# Patient Record
Sex: Female | Born: 1970 | Race: White | Hispanic: No | Smoking: Never smoker
Health system: Southern US, Community
[De-identification: ages and names within clinical notes are randomized; demographics above are authoritative.]

## PROBLEM LIST (undated history)

## (undated) DIAGNOSIS — M1990 Unspecified osteoarthritis, unspecified site: Secondary | ICD-10-CM

## (undated) HISTORY — DX: Unspecified osteoarthritis, unspecified site: M19.90

---

## 2001-03-20 ENCOUNTER — Encounter: Payer: Self-pay | Admitting: Obstetrics and Gynecology

## 2001-03-20 ENCOUNTER — Ambulatory Visit (HOSPITAL_COMMUNITY): Admission: RE | Admit: 2001-03-20 | Discharge: 2001-03-20 | Payer: Self-pay | Admitting: Obstetrics and Gynecology

## 2001-05-27 ENCOUNTER — Ambulatory Visit (HOSPITAL_COMMUNITY): Admission: AD | Admit: 2001-05-27 | Discharge: 2001-05-27 | Payer: Self-pay | Admitting: Obstetrics and Gynecology

## 2001-06-02 ENCOUNTER — Ambulatory Visit (HOSPITAL_COMMUNITY): Admission: AD | Admit: 2001-06-02 | Discharge: 2001-06-02 | Payer: Self-pay | Admitting: Obstetrics and Gynecology

## 2001-06-09 ENCOUNTER — Inpatient Hospital Stay (HOSPITAL_COMMUNITY): Admission: AD | Admit: 2001-06-09 | Discharge: 2001-06-11 | Payer: Self-pay | Admitting: *Deleted

## 2004-12-10 ENCOUNTER — Ambulatory Visit: Payer: Self-pay | Admitting: Family Medicine

## 2004-12-13 ENCOUNTER — Ambulatory Visit (HOSPITAL_COMMUNITY): Admission: RE | Admit: 2004-12-13 | Discharge: 2004-12-13 | Payer: Self-pay | Admitting: Family Medicine

## 2005-01-16 ENCOUNTER — Emergency Department (HOSPITAL_COMMUNITY): Admission: EM | Admit: 2005-01-16 | Discharge: 2005-01-16 | Payer: Self-pay | Admitting: Emergency Medicine

## 2005-01-21 ENCOUNTER — Encounter (HOSPITAL_COMMUNITY): Admission: RE | Admit: 2005-01-21 | Discharge: 2005-02-20 | Payer: Self-pay | Admitting: Orthopaedic Surgery

## 2005-04-06 ENCOUNTER — Ambulatory Visit: Payer: Self-pay | Admitting: Family Medicine

## 2005-04-26 ENCOUNTER — Emergency Department (HOSPITAL_COMMUNITY): Admission: EM | Admit: 2005-04-26 | Discharge: 2005-04-26 | Payer: Self-pay | Admitting: Emergency Medicine

## 2006-01-15 ENCOUNTER — Emergency Department (HOSPITAL_COMMUNITY): Admission: EM | Admit: 2006-01-15 | Discharge: 2006-01-15 | Payer: Self-pay | Admitting: Emergency Medicine

## 2007-10-04 ENCOUNTER — Encounter: Payer: Self-pay | Admitting: Family Medicine

## 2010-11-02 NOTE — Letter (Signed)
Summary: rpc chart  rpc chart   Imported By: Curtis Sites 07/21/2010 11:31:45  _____________________________________________________________________  External Attachment:    Type:   Image     Comment:   External Document

## 2013-01-31 ENCOUNTER — Other Ambulatory Visit (HOSPITAL_COMMUNITY): Payer: Self-pay | Admitting: *Deleted

## 2013-01-31 ENCOUNTER — Ambulatory Visit (HOSPITAL_COMMUNITY)
Admission: RE | Admit: 2013-01-31 | Discharge: 2013-01-31 | Disposition: A | Discharge status: Home / Self Care | Payer: Medicaid Other | Source: Ambulatory Visit | Attending: Family Medicine | Admitting: Family Medicine

## 2013-01-31 DIAGNOSIS — M545 Low back pain, unspecified: Secondary | ICD-10-CM | POA: Insufficient documentation

## 2013-01-31 DIAGNOSIS — Q762 Congenital spondylolisthesis: Secondary | ICD-10-CM | POA: Insufficient documentation

## 2019-10-03 DIAGNOSIS — M25571 Pain in right ankle and joints of right foot: Secondary | ICD-10-CM | POA: Diagnosis not present

## 2019-10-03 DIAGNOSIS — M7989 Other specified soft tissue disorders: Secondary | ICD-10-CM | POA: Diagnosis not present

## 2019-10-19 ENCOUNTER — Encounter: Payer: Self-pay | Admitting: Family Medicine

## 2019-10-24 ENCOUNTER — Other Ambulatory Visit: Payer: Self-pay

## 2019-10-24 ENCOUNTER — Encounter: Payer: Self-pay | Admitting: Family Medicine

## 2019-10-24 ENCOUNTER — Ambulatory Visit (INDEPENDENT_AMBULATORY_CARE_PROVIDER_SITE_OTHER): Payer: Medicaid Other | Admitting: Family Medicine

## 2019-10-24 VITALS — BP 134/84 | HR 91 | Temp 98.9°F | Ht 65.0 in | Wt 265.0 lb

## 2019-10-24 DIAGNOSIS — Z23 Encounter for immunization: Secondary | ICD-10-CM

## 2019-10-24 DIAGNOSIS — Z0000 Encounter for general adult medical examination without abnormal findings: Secondary | ICD-10-CM

## 2019-10-24 DIAGNOSIS — M1990 Unspecified osteoarthritis, unspecified site: Secondary | ICD-10-CM | POA: Diagnosis not present

## 2019-10-24 MED ORDER — MELOXICAM 15 MG PO TABS: 15.0000 mg | ORAL_TABLET | Freq: Every day | ORAL | 2 refills | Status: DC

## 2019-10-24 MED ORDER — SHINGRIX 50 MCG/0.5ML IM SUSR: 0.5000 mL | Freq: Once | INTRAMUSCULAR | 0 refills | Status: AC

## 2019-10-24 NOTE — Patient Instructions (Addendum)
Call office back to schedule your mammogram on the bus.  Ibuprofen 400-600 (2-3 tablets) WITH Tylenol 500 mg every 6-8 hours as needed for pain.    Stool DNA Testing for Colon Cancer Why am I having this test? Colon cancer is the second-leading cause of cancer deaths in men and women. Testing for colon cancer before symptoms develop (screening) reduces your risk for this cancer. Stool DNA testing, also called the FIT-DNA test, is one method of screening for colon cancer. Colon cancer grows slowly, so finding the cancer early means a better chance for effective treatment. All adults should have colon cancer screening starting at age 56 and continuing until age 83. Your health care provider may recommend screening at age 76. Screening may include having stool DNA testing every 3 years if:  You have no symptoms of colon cancer. Symptoms include rectal bleeding, weight loss, and changes in bowel habits.  You have an average risk of colon cancer. Average risk means: ? You do not have precancerous polyps. ? You do not have family or personal history of either colon cancer or a colon disease that increases your risk for colon cancer. What is being tested? For the test, a sample of your stool (feces) is checked for blood and changes in DNA that could lead to cancer. Growths in your colon that are cancerous (malignant) or may become cancerous (precancerous polyps) bleed and shed cells. Blood and cells can be picked up by stool as it passes through your colon. This test checks for blood cells as well as nine types of DNA (biomarkers) in three genes that have been linked to colon cancer and precancerous polyps. What kind of sample is taken?  This test uses a stool sample that is collected when you have a bowel movement. How do I collect samples at home? You will be sent a stool sample collection kit in the mail. It will include instructions and everything you need to get the sample. Instructions may  include these steps:  Store the kit in a dry place at room temperature until you are ready to collect the sample. Keep the kit away from heat and sunlight.  To collect the sample, place the collection device over the toilet.  Collect the sample according to the instructions that came with your kit.  After collecting a stool sample, follow instructions carefully regarding mixing the stool with a preservative and sealing the sample.  Send the sample to the lab in the postage-paid box that was included in the kit. Your stool sample will be checked within 3 days. The results will be sent to your health care provider. How do I prepare for this test? There is no preparation required for this test. However, do not collect a stool sample if:  You have bleeding hemorrhoids.  You have any rectal bleeding.  You have a cut on your hand or finger.  You have your menstrual period.  You have diarrhea. How are the results reported? Your test results will be reported as either positive or negative. It is up to you to get your test results. Ask your health care provider, or the department that is doing the test, when your results will be ready. What do the results mean?  A positive result means that the test found abnormal DNA, blood cells, or both. If you have a positive result, you will need to have a follow-up exam of your colon done with a scope (colonoscopy).  A negative result means that no  blood or changes in DNA were found. This does not guarantee that you do not have colon cancer. Your health care provider may recommend that you have other screening tests. Talk with your health care provider to discuss your results, treatment options, and if necessary, the need for more tests. Talk with your health care provider if you have any questions about your results. Questions to ask [your / your child's] health care provider Ask [your / your child's] health care provider, or the department that is  doing the test: This information is not intended to replace advice given to you by your health care provider. Make sure you discuss any questions you have with your health care provider. Document Revised: 01/10/2019 Document Reviewed: 06/09/2016 Elsevier Patient Education  [place].   Arthritis Arthritis means joint pain. It can also mean joint disease. A joint is a place where bones come together. There are more than 100 types of arthritis. What are the causes? This condition may be caused by:  Wear and tear of a joint. This is the most common cause.  A lot of acid in the blood, which leads to pain in the joint (gout).  Pain and swelling (inflammation) in a joint.  Infection of a joint.  Injuries in the joint.  A reaction to medicines (allergy). In some cases, the cause may not be known. What are the signs or symptoms? Symptoms of this condition include:  Redness at a joint.  Swelling at a joint.  Stiffness at a joint.  Warmth coming from the joint.  A fever.  A feeling of being sick. How is this treated? This condition may be treated with:  Treating the cause, if it is known.  Rest.  Raising (elevating) the joint.  Putting cold or hot packs on the joint.  Medicines to treat symptoms and reduce pain and swelling.  Shots of medicines (cortisone) into the joint. You may also be told to make changes in your life, such as doing exercises and losing weight. Follow these instructions at home: Medicines  Take over-the-counter and prescription medicines only as told by your doctor.  Do not take aspirin for pain if your doctor says that you may have gout. Activity  Rest your joint if your doctor tells you to.  Avoid activities that make the pain worse.  Exercise your joint regularly as told by your doctor. Try doing exercises like: ? Swimming. ? Water aerobics. ? Biking. ? Walking. Managing pain, stiffness, and swelling      If told, put ice  on the affected area. ? Put ice in a plastic bag. ? Place a towel between your skin and the bag. ? Leave the ice on for 20 minutes, 2-3 times per day.  If your joint is swollen, raise (elevate) it above the level of your heart if told by your doctor.  If your joint feels stiff in the morning, try taking a warm shower.  If told, put heat on the affected area. Do this as often as told by your doctor. Use the heat source that your doctor recommends, such as a moist heat pack or a heating pad. If you have diabetes, do not apply heat without asking your doctor. To apply heat: ? Place a towel between your skin and the heat source. ? Leave the heat on for 20-30 minutes. ? Remove the heat if your skin turns bright red. This is very important if you are unable to feel pain, heat, or cold. You may  have a greater risk of getting burned. General instructions  Do not use any products that contain nicotine or tobacco, such as cigarettes, e-cigarettes, and chewing tobacco. If you need help quitting, ask your doctor.  Keep all follow-up visits as told by your doctor. This is important. Contact a doctor if:  The pain gets worse.  You have a fever. Get help right away if:  You have very bad pain in your joint.  You have swelling in your joint.  Your joint is red.  Many joints become painful and swollen.  You have very bad back pain.  Your leg is very weak.  You cannot control your pee (urine) or poop (stool). Summary  Arthritis means joint pain. It can also mean joint disease. A joint is a place where bones come together.  The most common cause of this condition is wear and tear of a joint.  Symptoms of this condition include redness, swelling, or stiffness of the joint.  This condition is treated with rest, raising the joint, medicines, and putting cold or hot packs on the joint.  Follow your doctor's instructions about medicines, activity, exercises, and other home care  treatments. This information is not intended to replace advice given to you by your health care provider. Make sure you discuss any questions you have with your health care provider. Document Revised: 08/27/2018 Document Reviewed: 08/27/2018 Elsevier Patient Education  2020 Elsevier Inc.   Preventive Care 41-65 Years Old, Female Preventive care refers to visits with your health care provider and lifestyle choices that can promote health and wellness. This includes:  A yearly physical exam. This may also be called an annual well check.  Regular dental visits and eye exams.  Immunizations.  Screening for certain conditions.  Healthy lifestyle choices, such as eating a healthy diet, getting regular exercise, not using drugs or products that contain nicotine and tobacco, and limiting alcohol use. What can I expect for my preventive care visit? Physical exam Your health care provider will check your:  Height and weight. This may be used to calculate body mass index (BMI), which tells if you are at a healthy weight.  Heart rate and blood pressure.  Skin for abnormal spots. Counseling Your health care provider may ask you questions about your:  Alcohol, tobacco, and drug use.  Emotional well-being.  Home and relationship well-being.  Sexual activity.  Eating habits.  Work and work Statistician.  Method of birth control.  Menstrual cycle.  Pregnancy history. What immunizations do I need?  Influenza (flu) vaccine  This is recommended every year. Tetanus, diphtheria, and pertussis (Tdap) vaccine  You may need a Td booster every 10 years. Varicella (chickenpox) vaccine  You may need this if you have not been vaccinated. Zoster (shingles) vaccine  You may need this after age 67. Measles, mumps, and rubella (MMR) vaccine  You may need at least one dose of MMR if you were born in 1957 or later. You may also need a second dose. Pneumococcal conjugate (PCV13)  vaccine  You may need this if you have certain conditions and were not previously vaccinated. Pneumococcal polysaccharide (PPSV23) vaccine  You may need one or two doses if you smoke cigarettes or if you have certain conditions. Meningococcal conjugate (MenACWY) vaccine  You may need this if you have certain conditions. Hepatitis A vaccine  You may need this if you have certain conditions or if you travel or work in places where you may be exposed to hepatitis A. Hepatitis  B vaccine  You may need this if you have certain conditions or if you travel or work in places where you may be exposed to hepatitis B. Haemophilus influenzae type b (Hib) vaccine  You may need this if you have certain conditions. Human papillomavirus (HPV) vaccine  If recommended by your health care provider, you may need three doses over 6 months. You may receive vaccines as individual doses or as more than one vaccine together in one shot (combination vaccines). Talk with your health care provider about the risks and benefits of combination vaccines. What tests do I need? Blood tests  Lipid and cholesterol levels. These may be checked every 5 years, or more frequently if you are over 56 years old.  Hepatitis C test.  Hepatitis B test. Screening  Lung cancer screening. You may have this screening every year starting at age 63 if you have a 30-pack-year history of smoking and currently smoke or have quit within the past 15 years.  Colorectal cancer screening. All adults should have this screening starting at age 33 and continuing until age 43. Your health care provider may recommend screening at age 25 if you are at increased risk. You will have tests every 1-10 years, depending on your results and the type of screening test.  Diabetes screening. This is done by checking your blood sugar (glucose) after you have not eaten for a while (fasting). You may have this done every 1-3 years.  Mammogram. This may be  done every 1-2 years. Talk with your health care provider about when you should start having regular mammograms. This may depend on whether you have a family history of breast cancer.  BRCA-related cancer screening. This may be done if you have a family history of breast, ovarian, tubal, or peritoneal cancers.  Pelvic exam and Pap test. This may be done every 3 years starting at age 47. Starting at age 38, this may be done every 5 years if you have a Pap test in combination with an HPV test. Other tests  Sexually transmitted disease (STD) testing.  Bone density scan. This is done to screen for osteoporosis. You may have this scan if you are at high risk for osteoporosis. Follow these instructions at home: Eating and drinking  Eat a diet that includes fresh fruits and vegetables, whole grains, lean protein, and low-fat dairy.  Take vitamin and mineral supplements as recommended by your health care provider.  Do not drink alcohol if: ? Your health care provider tells you not to drink. ? You are pregnant, may be pregnant, or are planning to become pregnant.  If you drink alcohol: ? Limit how much you have to 0-1 drink a day. ? Be aware of how much alcohol is in your drink. In the [place] one drink equals one 12 oz bottle of beer (355 mL), one 5 oz glass of wine (148 mL), or one 1 oz glass of hard liquor (44 mL). Lifestyle  Take daily care of your teeth and gums.  Stay active. Exercise for at least 30 minutes on 5 or more days each week.  Do not use any products that contain nicotine or tobacco, such as cigarettes, e-cigarettes, and chewing tobacco. If you need help quitting, ask your health care provider.  If you are sexually active, practice safe sex. Use a condom or other form of birth control (contraception) in order to prevent pregnancy and STIs (sexually transmitted infections).  If told by your health care provider, take low-dose aspirin  daily starting at age 21. What's  next?  Visit your health care provider once a year for a well check visit.  Ask your health care provider how often you should have your eyes and teeth checked.  Stay up to date on all vaccines. This information is not intended to replace advice given to you by your health care provider. Make sure you discuss any questions you have with your health care provider. Document Revised: 05/31/2018 Document Reviewed: 05/31/2018 Elsevier Patient Education  2020 [employer].

## 2019-10-24 NOTE — Progress Notes (Signed)
New Patient Office Visit  Assessment & Plan:  1. Well adult exam - Preventive care education provided. Education provided on [employer]. Patient is thinking about Cologuard and a Pap smear. Her last Pap smear was when she had her last child 17 years ago. She declined any lab work today. Patient will call office back to schedule her mammogram as she does not have her schedule with her today. Declined influenza and tetanus.  2. Arthritis - Ibuprofen 400-600 (2-3 tablets) WITH Tylenol 500 mg every 6-8 hours as needed for pain OR meloxicam (not to take both meloxicam and Ibuprofen). Education provided on arthritis. - meloxicam (MOBIC) 15 MG tablet; Take 1 tablet (15 mg total) by mouth daily.  Dispense: 30 tablet; Refill: 2  3. Morbid obesity ([place]) - Weight loss encouraged.   4. Immunization due - SHINGRIX injection; Inject 0.5 mLs into the muscle once for 1 dose.  Dispense: 0.5 mL; Refill: 0   Follow-up: Return in about 1 year (around 10/23/2020) for annual physical.   Hendricks Limes, MSN, APRN, FNP-C [facility]  Subjective:  Patient ID: Wanda Murphy, female    DOB: Sep 28, 1971  Age: 49 y.o. MRN: [id number]  Patient Care Team: Loman Brooklyn, FNP as PCP - General (Family Medicine)  CC:  Chief Complaint  Patient presents with  . New Patient (Initial Visit)    HPI Wanda Murphy presents to establish care. Patient did not have previous PCP.    Patient is also following up from an ER visit on 10/03/19. She presented to [facility] due to right ankle pain and popping.  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 She was given meloxicam 7.5 mg which she reports is somewhat effective but not enough. She was advised to see a podiatrist but has not done so as she reports local physicians do not accept Medicaid. Patient works as a Scientist, water quality.    Review of Systems    Constitutional: Negative for chills, fever, malaise/fatigue and weight loss.  HENT: Negative for congestion, ear discharge, ear pain, nosebleeds, sinus pain, sore throat and tinnitus.   Eyes: Negative for blurred vision, double vision, pain, discharge and redness.  Respiratory: Negative for cough, shortness of breath and wheezing.   Cardiovascular: Negative for chest pain, palpitations and leg swelling.  Gastrointestinal: Negative for abdominal pain, constipation, diarrhea, heartburn, nausea and vomiting.  Genitourinary: Negative for dysuria, frequency and urgency.  Musculoskeletal: Positive for joint pain. Negative for myalgias.  Skin: Negative for rash.  Neurological: Negative for dizziness, seizures, weakness and headaches.  Psychiatric/Behavioral: Negative for depression, substance abuse and suicidal ideas. The patient is not nervous/anxious.     Current Outpatient Medications:  .  meloxicam (MOBIC) 15 MG tablet, Take 1 tablet (15 mg total) by mouth daily., Disp: 30 tablet, Rfl: 2  No Known Allergies  Past Medical History:  Diagnosis Date  . Arthritis     Past Surgical History:  Procedure Laterality Date  . TUBAL LIGATION  2004    Family History  Problem Relation Age of Onset  . Breast cancer Mother 67  . Early death Father        fall with head injury  . Diabetes Maternal Grandmother   . Stroke Maternal Grandmother   . Early death Maternal Grandmother        33s  . Heart attack Maternal Grandfather   . Early death Paternal Grandfather  farming accident  . ADD / ADHD Son   . Other Son        joint problems  . Mental retardation Daughter     Social History   Socioeconomic History  . Marital status: Single    Spouse name: Not on file  . Number of children: Not on file  . Years of education: Not on file  . Highest education level: Not on file  Occupational History  . Not on file  Tobacco Use  . Smoking status: Never Smoker  . Smokeless tobacco: Never  Used  Substance and Sexual Activity  . Alcohol use: Never  . Drug use: Never  . Sexual activity: Not Currently  Other Topics Concern  . Not on file  Social History Narrative  . Not on file   Social Determinants of Health   Financial Resource Strain:   . Difficulty of Paying Living Expenses: Not on file  Food Insecurity:   . Worried About Programme researcher, broadcasting/film/video in the Last Year: Not on file  . Ran Out of Food in the Last Year: Not on file  Transportation Needs:   . Lack of Transportation (Medical): Not on file  . Lack of Transportation (Non-Medical): Not on file  Physical Activity:   . Days of Exercise per Week: Not on file  . Minutes of Exercise per Session: Not on file  Stress:   . Feeling of Stress : Not on file  Social Connections:   . Frequency of Communication with Friends and Family: Not on file  . Frequency of Social Gatherings with Friends and Family: Not on file  . Attends Religious Services: Not on file  . Active Member of Clubs or Organizations: Not on file  . Attends Banker Meetings: Not on file  . Marital Status: Not on file  Intimate Partner Violence:   . Fear of Current or Ex-Partner: Not on file  . Emotionally Abused: Not on file  . Physically Abused: Not on file  . Sexually Abused: Not on file    Objective:   Today's Vitals: BP 134/84 Comment: manual  Pulse 91   Temp 98.9 F (37.2 C) (Temporal)   Ht 5\' 5"  (1.651 m)   Wt 265 lb (120.2 kg)   SpO2 98%   BMI 44.10 kg/m   Physical Exam Vitals reviewed.  Constitutional:      General: She is not in acute distress.    Appearance: Normal appearance. She is morbidly obese. She is not ill-appearing, toxic-appearing or diaphoretic.  HENT:     Head: Normocephalic and atraumatic.     Right Ear: Tympanic membrane, ear canal and external ear normal. There is no impacted cerumen.     Left Ear: Tympanic membrane, ear canal and external ear normal. There is no impacted cerumen.     Nose: Nose  normal. No congestion or rhinorrhea.     Mouth/Throat:     Mouth: Mucous membranes are moist.     Pharynx: Oropharynx is clear. No oropharyngeal exudate or posterior oropharyngeal erythema.  Eyes:     General: No scleral icterus.       Right eye: No discharge.        Left eye: No discharge.     Conjunctiva/sclera: Conjunctivae normal.     Pupils: Pupils are equal, round, and reactive to light.  Cardiovascular:     Rate and Rhythm: Normal rate and regular rhythm.     Heart sounds: Normal heart sounds. No murmur. No friction  rub. No gallop.   Pulmonary:     Effort: Pulmonary effort is normal. No respiratory distress.     Breath sounds: Normal breath sounds. No stridor. No wheezing, rhonchi or rales.  Abdominal:     General: Abdomen is flat. Bowel sounds are normal. There is no distension.     Palpations: Abdomen is soft. There is no mass.     Tenderness: There is no abdominal tenderness. There is no guarding or rebound.     Hernia: No hernia is present.  Musculoskeletal:        General: Normal range of motion.     Cervical back: Normal range of motion and neck supple. No rigidity. No muscular tenderness.  Lymphadenopathy:     Cervical: No cervical adenopathy.  Skin:    General: Skin is warm and dry.     Capillary Refill: Capillary refill takes less than 2 seconds.  Neurological:     General: No focal deficit present.     Mental Status: She is alert and oriented to person, place, and time. Mental status is at baseline.  Psychiatric:        Mood and Affect: Mood normal.        Behavior: Behavior normal.        Thought Content: Thought content normal.        Judgment: Judgment normal.

## 2019-11-10 DIAGNOSIS — R05 Cough: Secondary | ICD-10-CM | POA: Diagnosis not present

## 2019-11-10 DIAGNOSIS — H6503 Acute serous otitis media, bilateral: Secondary | ICD-10-CM | POA: Diagnosis not present

## 2019-12-19 ENCOUNTER — Encounter: Payer: Self-pay | Admitting: Family Medicine

## 2019-12-19 ENCOUNTER — Other Ambulatory Visit: Payer: Self-pay

## 2019-12-19 ENCOUNTER — Encounter: Payer: Medicaid Other | Admitting: Family Medicine

## 2019-12-19 DIAGNOSIS — R197 Diarrhea, unspecified: Secondary | ICD-10-CM | POA: Diagnosis not present

## 2019-12-19 DIAGNOSIS — R112 Nausea with vomiting, unspecified: Secondary | ICD-10-CM | POA: Diagnosis not present

## 2019-12-19 DIAGNOSIS — R5383 Other fatigue: Secondary | ICD-10-CM | POA: Diagnosis not present

## 2019-12-19 NOTE — Progress Notes (Signed)
Patient went to urgent care

## 2020-02-02 DIAGNOSIS — H5213 Myopia, bilateral: Secondary | ICD-10-CM | POA: Diagnosis not present

## 2021-01-16 DIAGNOSIS — S93402A Sprain of unspecified ligament of left ankle, initial encounter: Secondary | ICD-10-CM | POA: Diagnosis not present

## 2021-01-16 DIAGNOSIS — X500XXA Overexertion from strenuous movement or load, initial encounter: Secondary | ICD-10-CM | POA: Diagnosis not present

## 2021-01-16 DIAGNOSIS — S8392XA Sprain of unspecified site of left knee, initial encounter: Secondary | ICD-10-CM | POA: Diagnosis not present

## 2021-01-16 DIAGNOSIS — M25562 Pain in left knee: Secondary | ICD-10-CM | POA: Diagnosis not present

## 2021-01-16 DIAGNOSIS — M7989 Other specified soft tissue disorders: Secondary | ICD-10-CM | POA: Diagnosis not present

## 2021-01-18 ENCOUNTER — Telehealth: Payer: Self-pay | Admitting: *Deleted

## 2021-01-18 NOTE — Telephone Encounter (Signed)
Transition Care Management Follow-up Telephone Call  Date of discharge and from where: 01/16/2021 [facility] ED  How have you been since you were released from the hospital? "Still in a bit of pain but it's a little better"  Any questions or concerns? No  Items Reviewed:  Did the pt receive and understand the discharge instructions provided? Yes   Medications obtained and verified? Yes   Other? No   Any new allergies since your discharge? No   Dietary orders reviewed? No  Do you have support at home? Yes    Functional Questionnaire: (I = Independent and D = Dependent) ADLs: I  Bathing/Dressing- I  Meal Prep- I  Eating- I  Maintaining continence- I  Transferring/Ambulation- I  Managing Meds- I  Follow up appointments reviewed:   PCP Hospital f/u appt confirmed? No    Specialist Hospital f/u appt confirmed? No    Are transportation arrangements needed? N/A  If their condition worsens, is the pt aware to call PCP or go to the Emergency Dept.? Yes  Was the patient provided with contact information for the PCP's office or ED? Yes  Was to pt encouraged to call back with questions or concerns? Yes

## 2021-01-28 ENCOUNTER — Other Ambulatory Visit: Payer: Self-pay | Admitting: Family Medicine

## 2021-01-28 DIAGNOSIS — Z1231 Encounter for screening mammogram for malignant neoplasm of breast: Secondary | ICD-10-CM

## 2021-03-18 DIAGNOSIS — S29012A Strain of muscle and tendon of back wall of thorax, initial encounter: Secondary | ICD-10-CM | POA: Diagnosis not present

## 2021-04-16 ENCOUNTER — Ambulatory Visit (INDEPENDENT_AMBULATORY_CARE_PROVIDER_SITE_OTHER): Payer: Medicaid Other | Admitting: Family Medicine

## 2021-04-16 ENCOUNTER — Encounter: Payer: Self-pay | Admitting: Family Medicine

## 2021-04-16 ENCOUNTER — Other Ambulatory Visit: Payer: Self-pay

## 2021-04-16 VITALS — BP 124/84 | HR 89 | Temp 97.9°F | Ht 65.0 in | Wt 267.4 lb

## 2021-04-16 DIAGNOSIS — M722 Plantar fascial fibromatosis: Secondary | ICD-10-CM | POA: Diagnosis not present

## 2021-04-16 DIAGNOSIS — Z0000 Encounter for general adult medical examination without abnormal findings: Secondary | ICD-10-CM | POA: Diagnosis not present

## 2021-04-16 DIAGNOSIS — M7989 Other specified soft tissue disorders: Secondary | ICD-10-CM | POA: Diagnosis not present

## 2021-04-16 DIAGNOSIS — M7918 Myalgia, other site: Secondary | ICD-10-CM

## 2021-04-16 DIAGNOSIS — Z23 Encounter for immunization: Secondary | ICD-10-CM

## 2021-04-16 DIAGNOSIS — Z0001 Encounter for general adult medical examination with abnormal findings: Secondary | ICD-10-CM

## 2021-04-16 LAB — BAYER DCA HB A1C WAIVED: HB A1C (BAYER DCA - WAIVED): 5.6 % (ref <7.0)

## 2021-04-16 NOTE — Patient Instructions (Addendum)
Biofreeze for the bottom of your foot when it feels tight.  Start wearing compression hose for swelling.   Preventive Care 52-51 Years Old, Female Preventive care refers to lifestyle choices and visits with your health care provider that can promote health and wellness. This includes: A yearly physical exam. This is also called an annual wellness visit. Regular dental and eye exams. Immunizations. Screening for certain conditions. Healthy lifestyle choices, such as: Eating a healthy diet. Getting regular exercise. Not using drugs or products that contain nicotine and tobacco. Limiting alcohol use. What can I expect for my preventive care visit? Physical exam Your health care provider will check your: Height and weight. These may be used to calculate your BMI (body mass index). BMI is a measurement that tells if you are at a healthy weight. Heart rate and blood pressure. Body temperature. Skin for abnormal spots. Counseling Your health care provider may ask you questions about your: Past medical problems. Family's medical history. Alcohol, tobacco, and drug use. Emotional well-being. Home life and relationship well-being. Sexual activity. Diet, exercise, and sleep habits. Work and work Statistician. Access to firearms. Method of birth control. Menstrual cycle. Pregnancy history. What immunizations do I need?  Vaccines are usually given at various ages, according to a schedule. Your health care provider will recommend vaccines for you based on your age, medicalhistory, and lifestyle or other factors, such as travel or where you work. What tests do I need? Blood tests Lipid and cholesterol levels. These may be checked every 5 years, or more often if you are over 35 years old. Hepatitis C test. Hepatitis B test. Screening Lung cancer screening. You may have this screening every year starting at age 82 if you have a 30-pack-year history of smoking and currently smoke or have  quit within the past 15 years. Colorectal cancer screening. All adults should have this screening starting at age 51 and continuing until age 51. Your health care provider may recommend screening at age 30 if you are at increased risk. You will have tests every 1-10 years, depending on your results and the type of screening test. Diabetes screening. This is done by checking your blood sugar (glucose) after you have not eaten for a while (fasting). You may have this done every 1-3 years. Mammogram. This may be done every 1-2 years. Talk with your health care provider about when you should start having regular mammograms. This may depend on whether you have a family history of breast cancer. BRCA-related cancer screening. This may be done if you have a family history of breast, ovarian, tubal, or peritoneal cancers. Pelvic exam and Pap test. This may be done every 3 years starting at age 51. Starting at age 51, this may be done every 5 years if you have a Pap test in combination with an HPV test. Other tests STD (sexually transmitted disease) testing, if you are at risk. Bone density scan. This is done to screen for osteoporosis. You may have this scan if you are at high risk for osteoporosis. Talk with your health care provider about your test results, treatment options,and if necessary, the need for more tests. Follow these instructions at home: Eating and drinking  Eat a diet that includes fresh fruits and vegetables, whole grains, lean protein, and low-fat dairy products. Take vitamin and mineral supplements as recommended by your health care provider. Do not drink alcohol if: Your health care provider tells you not to drink. You are pregnant, may be pregnant, or are  planning to become pregnant. If you drink alcohol: Limit how much you have to 0-1 drink a day. Be aware of how much alcohol is in your drink. In the [place] one drink equals one 12 oz bottle of beer (355 mL), one 5 oz glass  of wine (148 mL), or one 1 oz glass of hard liquor (44 mL).  Lifestyle Take daily care of your teeth and gums. Brush your teeth every morning and night with fluoride toothpaste. Floss one time each day. Stay active. Exercise for at least 30 minutes 5 or more days each week. Do not use any products that contain nicotine or tobacco, such as cigarettes, e-cigarettes, and chewing tobacco. If you need help quitting, ask your health care provider. Do not use drugs. If you are sexually active, practice safe sex. Use a condom or other form of protection to prevent STIs (sexually transmitted infections). If you do not wish to become pregnant, use a form of birth control. If you plan to become pregnant, see your health care provider for a prepregnancy visit. If told by your health care provider, take low-dose aspirin daily starting at age 8. Find healthy ways to cope with stress, such as: Meditation, yoga, or listening to music. Journaling. Talking to a trusted person. Spending time with friends and family. Safety Always wear your seat belt while driving or riding in a vehicle. Do not drive: If you have been drinking alcohol. Do not ride with someone who has been drinking. When you are tired or distracted. While texting. Wear a helmet and other protective equipment during sports activities. If you have firearms in your house, make sure you follow all gun safety procedures. What's next? Visit your health care provider once a year for an annual wellness visit. Ask your health care provider how often you should have your eyes and teeth checked. Stay up to date on all vaccines. This information is not intended to replace advice given to you by your health care provider. Make sure you discuss any questions you have with your healthcare provider. Document Revised: 06/23/2020 Document Reviewed: 05/31/2018 Elsevier Patient Education  2022 [employer].

## 2021-04-16 NOTE — Progress Notes (Signed)
Assessment & Plan:  1. Well adult exam Preventive health education provided. Patient declined COVID, hepatitis C and HIV screening. She is thinking about Cologuard vs a colonoscopy. She will get Shingrix at the pharmacy and complete her mammogram on the bus that comes to our office.  - Anemia Profile B - CMP14+EGFR - Lipid panel - TSH  2. Morbid obesity ([place]) Encouraged to work on healthy eating and exercise prior to starting a medication to help with weight loss. She will see our clinical pharmacist regarding medication. Education provided on obesity. - Anemia Profile B - CMP14+EGFR - Lipid panel - TSH - Bayer DCA Hb A1c Waived  3. Musculoskeletal pain Encouraged to take something for the pain as needed.  4. Swelling of both lower extremities Encouraged to wear compression hose. - Anemia Profile B - CMP14+EGFR - Lipid panel - TSH  5. Immunization due - Varicella-zoster vaccine IM (Shingrix) - patient to get at the pharmacy.   6. Plantar fasciitis of right foot Encouraged to purchase good support shoes, wear compression hose, and apply Biofreeze to the bottom of her foot as needed. I did not prescribe steroids since she is not having the pain daily and it is not present currently.    Follow-up: Return in about 6 weeks (around 05/28/2021) for weight with Almyra Free.   Hendricks Limes, MSN, APRN, FNP-C [facility]  Subjective:  Patient ID: Wanda Murphy, female    DOB: 27-Oct-1970  Age: 50 y.o. MRN: [id number]  Patient Care Team: Loman Brooklyn, FNP as PCP - General (Family Medicine)   CC:  Chief Complaint  Patient presents with   Annual Exam    HPI Wanda Murphy presents for her annual physical.  Occupation: Scientist, water quality at [employer], Marital status: Widowed, Substance use: None Diet: Regular, Exercise: None Last eye exam: 2 months ago Last dental exam: Does not go Last colonoscopy: Never Last mammogram: Never Last pap smear: 17+ years  ago Hepatitis C Screening: Declined Immunizations: Flu Vaccine:  not flu season Tdap Vaccine: up to date  Shingrix Vaccine: at the pharmacy for administration, so it can be run through insurance first   COVID-19 Vaccine: declined  DEPRESSION SCREENING PHQ 2/9 Scores 04/16/2021 10/24/2019  PHQ - 2 Score 1 0  PHQ- 9 Score 8 0     Patient reports a muscular pain across the top of her chest just under her left collarbone and in her left shoulder that occurs first thing in the morning. She reports she sleeps awkwardly on her left shoulder all night. She does not take anything for the pain.  Also reports a pain/tight feeling in the bottom of her right foot after being on her feet at work all day. Does not occur when she is not at work. This has been going on for several weeks.   She also reports swelling of both feet that is worse after standing all day at work.   She is concerned about her weight, although she is not currently doing any exercise or following a healthy diet.    Review of Systems  Constitutional:  Negative for chills, fever, malaise/fatigue and weight loss.  HENT:  Negative for congestion, ear discharge, ear pain, nosebleeds, sinus pain, sore throat and tinnitus.   Eyes:  Negative for blurred vision, double vision, pain, discharge and redness.  Respiratory:  Negative for cough, shortness of breath and wheezing.   Cardiovascular:  Positive for leg swelling. Negative for chest pain and palpitations.  Gastrointestinal:  Negative for abdominal pain, constipation, diarrhea, heartburn, nausea and vomiting.  Genitourinary:  Negative for dysuria, frequency and urgency.  Musculoskeletal:  Positive for joint pain. Negative for myalgias.  Skin:  Negative for rash.  Neurological:  Negative for dizziness, seizures, weakness and headaches.  Psychiatric/Behavioral:  Negative for depression, substance abuse and suicidal ideas. The patient is not nervous/anxious.    No current outpatient  medications on file.  No Known Allergies  Past Medical History:  Diagnosis Date   Arthritis     Past Surgical History:  Procedure Laterality Date   TUBAL LIGATION  2004    Family History  Problem Relation Age of Onset   Breast cancer Mother 63   Early death Father        fall with head injury   Diabetes Maternal Grandmother    Stroke Maternal Grandmother    Early death Maternal Grandmother        11s   Heart attack Maternal Grandfather    Early death Paternal Grandfather        farming accident   ADD / ADHD Son    Other Son        joint problems   Mental retardation Daughter     Social History   Socioeconomic History   Marital status: Single    Spouse name: Not on file   Number of children: Not on file   Years of education: Not on file   Highest education level: Not on file  Occupational History   Not on file  Tobacco Use   Smoking status: Never   Smokeless tobacco: Never  Vaping Use   Vaping Use: Never used  Substance and Sexual Activity   Alcohol use: Never   Drug use: Never   Sexual activity: Not Currently  Other Topics Concern   Not on file  Social History Narrative   Not on file   Social Determinants of Health   Financial Resource Strain: Not on file  Food Insecurity: Not on file  Transportation Needs: Not on file  Physical Activity: Not on file  Stress: Not on file  Social Connections: Not on file  Intimate Partner Violence: Not on file      Objective:    BP 124/84   Pulse 89   Temp 97.9 F (36.6 C) (Temporal)   Ht '5\' 5"'  (1.651 m)   Wt 267 lb 6.4 oz (121.3 kg)   SpO2 97%   BMI 44.50 kg/m   Wt Readings from Last 3 Encounters:  04/16/21 267 lb 6.4 oz (121.3 kg)  10/24/19 265 lb (120.2 kg)    Physical Exam Vitals reviewed.  Constitutional:      General: She is not in acute distress.    Appearance: Normal appearance. She is morbidly obese. She is not ill-appearing, toxic-appearing or diaphoretic.  HENT:     Head:  Normocephalic and atraumatic.     Right Ear: Tympanic membrane, ear canal and external ear normal. There is no impacted cerumen.     Left Ear: Tympanic membrane, ear canal and external ear normal. There is no impacted cerumen.     Nose: Nose normal. No congestion or rhinorrhea.     Mouth/Throat:     Mouth: Mucous membranes are moist.     Pharynx: Oropharynx is clear. No oropharyngeal exudate or posterior oropharyngeal erythema.  Eyes:     General: No scleral icterus.       Right eye: No discharge.        Left eye:  No discharge.     Conjunctiva/sclera: Conjunctivae normal.     Pupils: Pupils are equal, round, and reactive to light.  Cardiovascular:     Rate and Rhythm: Normal rate and regular rhythm.     Heart sounds: Normal heart sounds. No murmur heard.   No friction rub. No gallop.  Pulmonary:     Effort: Pulmonary effort is normal. No respiratory distress.     Breath sounds: Normal breath sounds. No stridor. No wheezing, rhonchi or rales.  Abdominal:     General: Abdomen is flat. Bowel sounds are normal. There is no distension.     Palpations: Abdomen is soft. There is no mass.     Tenderness: There is no abdominal tenderness. There is no guarding or rebound.     Hernia: No hernia is present.  Musculoskeletal:        General: Normal range of motion.     Cervical back: Normal range of motion and neck supple. No rigidity. No muscular tenderness.  Lymphadenopathy:     Cervical: No cervical adenopathy.  Skin:    General: Skin is warm and dry.     Capillary Refill: Capillary refill takes less than 2 seconds.  Neurological:     General: No focal deficit present.     Mental Status: She is alert and oriented to person, place, and time. Mental status is at baseline.  Psychiatric:        Mood and Affect: Mood normal.        Behavior: Behavior normal.        Thought Content: Thought content normal.        Judgment: Judgment normal.    No results found for: TSH No results found  for: WBC, HGB, HCT, MCV, PLT No results found for: NA, K, CHLORIDE, CO2, GLUCOSE, BUN, CREATININE, BILITOT, ALKPHOS, AST, ALT, PROT, ALBUMIN, CALCIUM, ANIONGAP, EGFR, GFR No results found for: CHOL No results found for: HDL No results found for: LDLCALC No results found for: TRIG No results found for: CHOLHDL No results found for: HGBA1C

## 2021-04-17 LAB — CMP14+EGFR
ALT: 17 IU/L (ref 0–32)
AST: 19 IU/L (ref 0–40)
Albumin/Globulin Ratio: 1.3 (ref 1.2–2.2)
Albumin: 4.3 g/dL (ref 3.8–4.9)
Alkaline Phosphatase: 112 IU/L (ref 44–121)
BUN/Creatinine Ratio: 21 (ref 9–23)
BUN: 17 mg/dL (ref 6–24)
Bilirubin Total: 0.3 mg/dL (ref 0.0–1.2)
CO2: 20 mmol/L (ref 20–29)
Calcium: 10.5 mg/dL — ABNORMAL HIGH (ref 8.7–10.2)
Chloride: 103 mmol/L (ref 96–106)
Creatinine, Ser: 0.82 mg/dL (ref 0.57–1.00)
Globulin, Total: 3.2 g/dL (ref 1.5–4.5)
Glucose: 96 mg/dL (ref 65–99)
Potassium: 4.4 mmol/L (ref 3.5–5.2)
Sodium: 139 mmol/L (ref 134–144)
Total Protein: 7.5 g/dL (ref 6.0–8.5)
eGFR: 87 mL/min/{1.73_m2} (ref >59)

## 2021-04-17 LAB — ANEMIA PROFILE B
Basophils Absolute: 0.1 10*3/uL (ref 0.0–0.2)
Basos: 1 %
EOS (ABSOLUTE): 0.1 10*3/uL (ref 0.0–0.4)
Eos: 2 %
Ferritin: 78 ng/mL (ref 15–150)
Folate: 5.2 ng/mL
Hematocrit: 47.1 % — ABNORMAL HIGH (ref 34.0–46.6)
Hemoglobin: 15.6 g/dL (ref 11.1–15.9)
Immature Grans (Abs): 0 10*3/uL (ref 0.0–0.1)
Immature Granulocytes: 0 %
Iron Saturation: 23 % (ref 15–55)
Iron: 66 ug/dL (ref 27–159)
Lymphocytes Absolute: 1.5 10*3/uL (ref 0.7–3.1)
Lymphs: 23 %
MCH: 28.9 pg (ref 26.6–33.0)
MCHC: 33.1 g/dL (ref 31.5–35.7)
MCV: 87 fL (ref 79–97)
Monocytes Absolute: 0.6 10*3/uL (ref 0.1–0.9)
Monocytes: 9 %
Neutrophils Absolute: 4.3 10*3/uL (ref 1.4–7.0)
Neutrophils: 65 %
Platelets: 245 10*3/uL (ref 150–450)
RBC: 5.39 x10E6/uL — ABNORMAL HIGH (ref 3.77–5.28)
RDW: 13.6 % (ref 11.7–15.4)
Retic Ct Pct: 1.6 % (ref 0.6–2.6)
Total Iron Binding Capacity: 285 ug/dL (ref 250–450)
UIBC: 219 ug/dL (ref 131–425)
Vitamin B-12: 378 pg/mL (ref 232–1245)
WBC: 6.5 10*3/uL (ref 3.4–10.8)

## 2021-04-17 LAB — LIPID PANEL
Chol/HDL Ratio: 4.3 ratio (ref 0.0–4.4)
Cholesterol, Total: 166 mg/dL (ref 100–199)
HDL: 39 mg/dL — ABNORMAL LOW (ref >39)
LDL Chol Calc (NIH): 86 mg/dL (ref 0–99)
Triglycerides: 246 mg/dL — ABNORMAL HIGH (ref 0–149)
VLDL Cholesterol Cal: 41 mg/dL — ABNORMAL HIGH (ref 5–40)

## 2021-04-17 LAB — TSH: TSH: 3.91 u[IU]/mL (ref 0.450–4.500)

## 2021-04-20 NOTE — Progress Notes (Signed)
Pt r/c about labs 

## 2021-04-26 ENCOUNTER — Encounter: Payer: Self-pay | Admitting: *Deleted

## 2021-04-26 NOTE — Progress Notes (Unsigned)
Went over all labs with pt

## 2021-05-04 DIAGNOSIS — I517 Cardiomegaly: Secondary | ICD-10-CM | POA: Diagnosis not present

## 2021-05-04 DIAGNOSIS — R079 Chest pain, unspecified: Secondary | ICD-10-CM | POA: Diagnosis not present

## 2021-05-04 DIAGNOSIS — R9431 Abnormal electrocardiogram [ECG] [EKG]: Secondary | ICD-10-CM | POA: Diagnosis not present

## 2021-05-04 DIAGNOSIS — R0789 Other chest pain: Secondary | ICD-10-CM | POA: Diagnosis not present

## 2021-05-04 DIAGNOSIS — J811 Chronic pulmonary edema: Secondary | ICD-10-CM | POA: Diagnosis not present

## 2021-05-05 ENCOUNTER — Telehealth: Payer: Self-pay

## 2021-05-05 NOTE — Telephone Encounter (Signed)
Transition Care Management Unsuccessful Follow-up Telephone Call  Date of discharge and from where:  05/04/2021[facility]   Attempts:  1st Attempt  Reason for unsuccessful TCM follow-up call:  Left voice message

## 2021-05-06 NOTE — Telephone Encounter (Signed)
Transition Care Management Unsuccessful Follow-up Telephone Call  Date of discharge and from where:  05/04/2021-[facility]   Attempts:  2nd Attempt  Reason for unsuccessful TCM follow-up call:  Left voice message

## 2021-05-07 NOTE — Telephone Encounter (Signed)
Transition Care Management Unsuccessful Follow-up Telephone Call  Date of discharge and from where:  05/04/2021[facility]   Attempts:  3rd Attempt  Reason for unsuccessful TCM follow-up call:  Left voice message

## 2021-06-01 ENCOUNTER — Ambulatory Visit: Payer: Medicaid Other | Admitting: Pharmacist

## 2021-06-02 ENCOUNTER — Other Ambulatory Visit: Payer: Self-pay

## 2021-06-02 ENCOUNTER — Ambulatory Visit
Admission: RE | Admit: 2021-06-02 | Discharge: 2021-06-02 | Disposition: A | Discharge status: Home / Self Care | Payer: Medicaid Other | Source: Ambulatory Visit | Attending: Family Medicine | Admitting: Family Medicine

## 2021-06-02 DIAGNOSIS — Z1231 Encounter for screening mammogram for malignant neoplasm of breast: Secondary | ICD-10-CM

## 2021-06-03 DIAGNOSIS — R079 Chest pain, unspecified: Secondary | ICD-10-CM | POA: Diagnosis not present

## 2021-06-03 DIAGNOSIS — Z7951 Long term (current) use of inhaled steroids: Secondary | ICD-10-CM | POA: Diagnosis not present

## 2021-06-03 DIAGNOSIS — R059 Cough, unspecified: Secondary | ICD-10-CM | POA: Diagnosis not present

## 2021-06-03 DIAGNOSIS — M1990 Unspecified osteoarthritis, unspecified site: Secondary | ICD-10-CM | POA: Diagnosis not present

## 2021-06-03 DIAGNOSIS — Z20822 Contact with and (suspected) exposure to covid-19: Secondary | ICD-10-CM | POA: Diagnosis not present

## 2021-06-03 DIAGNOSIS — R5383 Other fatigue: Secondary | ICD-10-CM | POA: Diagnosis not present

## 2021-06-04 ENCOUNTER — Telehealth: Payer: Self-pay | Admitting: *Deleted

## 2021-06-04 NOTE — Telephone Encounter (Signed)
Transition Care Management Follow-up Telephone Call Date of discharge and from where: 06/03/2021 [facility] ED How have you been since you were released from the hospital? "I am about the same" Any questions or concerns? No  Items Reviewed: Did the pt receive and understand the discharge instructions provided? Yes  Medications obtained and verified? Yes  Other? No  Any new allergies since your discharge? No  Dietary orders reviewed? No Do you have support at home? No    Functional Questionnaire: (I = Independent and D = Dependent) ADLs: I  Bathing/Dressing- I  Meal Prep- I  Eating- I  Maintaining continence- I  Transferring/Ambulation- I  Managing Meds- I  Follow up appointments reviewed:  PCP Hospital f/u appt confirmed? No   Specialist Hospital f/u appt confirmed? No   Are transportation arrangements needed? No  If their condition worsens, is the pt aware to call PCP or go to the Emergency Dept.? Yes Was the patient provided with contact information for the PCP's office or ED? Yes Was to pt encouraged to call back with questions or concerns? Yes

## 2021-06-08 ENCOUNTER — Other Ambulatory Visit: Payer: Self-pay | Admitting: Occupational Therapy

## 2021-06-08 DIAGNOSIS — R928 Other abnormal and inconclusive findings on diagnostic imaging of breast: Secondary | ICD-10-CM

## 2021-06-16 DIAGNOSIS — M25522 Pain in left elbow: Secondary | ICD-10-CM | POA: Diagnosis not present

## 2021-06-16 DIAGNOSIS — M7032 Other bursitis of elbow, left elbow: Secondary | ICD-10-CM | POA: Diagnosis not present

## 2021-06-18 DIAGNOSIS — M2391 Unspecified internal derangement of right knee: Secondary | ICD-10-CM | POA: Diagnosis not present

## 2021-06-18 DIAGNOSIS — M1711 Unilateral primary osteoarthritis, right knee: Secondary | ICD-10-CM | POA: Diagnosis not present

## 2021-06-18 DIAGNOSIS — M25561 Pain in right knee: Secondary | ICD-10-CM | POA: Diagnosis not present

## 2021-06-19 ENCOUNTER — Other Ambulatory Visit: Payer: Medicaid Other

## 2021-06-19 ENCOUNTER — Telehealth: Payer: Self-pay

## 2021-06-19 NOTE — Telephone Encounter (Signed)
Transition Care Management Follow-up Telephone Call Date of discharge and from where: 06/18/2021 from [facility] How have you been since you were released from the hospital? Pt stated that she was feeling better and did not have questions or concerns.  Any questions or concerns? No  Items Reviewed: Did the pt receive and understand the discharge instructions provided? Yes  Medications obtained and verified? Yes  Other? No  Any new allergies since your discharge? No  Dietary orders reviewed? No Do you have support at home? Yes   Functional Questionnaire: (I = Independent and D = Dependent) ADLs: I  Bathing/Dressing- I  Meal Prep- I  Eating- I  Maintaining continence- I  Transferring/Ambulation- I  Managing Meds- I   Follow up appointments reviewed:  PCP Hospital f/u appt confirmed? No   Specialist Hospital f/u appt confirmed? No   Are transportation arrangements needed? No  If their condition worsens, is the pt aware to call PCP or go to the Emergency Dept.? Yes Was the patient provided with contact information for the PCP's office or ED? Yes Was to pt encouraged to call back with questions or concerns? Yes

## 2021-06-28 ENCOUNTER — Other Ambulatory Visit: Payer: Medicaid Other

## 2021-06-28 ENCOUNTER — Ambulatory Visit: Payer: Medicaid Other | Admitting: Family Medicine

## 2021-07-04 DIAGNOSIS — Z20822 Contact with and (suspected) exposure to covid-19: Secondary | ICD-10-CM | POA: Diagnosis not present

## 2021-07-04 DIAGNOSIS — J069 Acute upper respiratory infection, unspecified: Secondary | ICD-10-CM | POA: Diagnosis not present

## 2021-07-06 ENCOUNTER — Encounter: Payer: Self-pay | Admitting: Nurse Practitioner

## 2021-07-06 ENCOUNTER — Other Ambulatory Visit: Payer: Self-pay

## 2021-07-06 ENCOUNTER — Ambulatory Visit: Payer: Medicaid Other | Admitting: Nurse Practitioner

## 2021-07-06 VITALS — BP 127/81 | HR 94 | Wt 267.0 lb

## 2021-07-06 DIAGNOSIS — M25561 Pain in right knee: Secondary | ICD-10-CM | POA: Diagnosis not present

## 2021-07-06 MED ORDER — IBUPROFEN 600 MG PO TABS: 600.0000 mg | ORAL_TABLET | Freq: Three times a day (TID) | ORAL | 0 refills | Status: DC | PRN

## 2021-07-06 MED ORDER — PREDNISONE 10 MG (21) PO TBPK: ORAL_TABLET | ORAL | 0 refills | Status: DC

## 2021-07-06 NOTE — Patient Instructions (Signed)
Acute Knee Pain, Adult Acute knee pain is sudden and may be caused by damage, swelling, or irritation of the muscles and tissues that support the knee. Pain may result from: A fall. An injury to the knee from twisting motions. A hit to the knee. Infection. Acute knee pain may go away on its own with time and rest. If it does not, your health care provider may order tests to find the cause of the pain. These may include: Imaging tests, such as an X-ray, MRI, CT scan, or ultrasound. Joint aspiration. In this test, fluid is removed from the knee and evaluated. Arthroscopy. In this test, a lighted tube is inserted into the knee and an image is projected onto a TV screen. Biopsy. In this test, a sample of tissue is removed from the body and studied under a microscope. Follow these instructions at home: If you have a knee sleeve or brace:  Wear the knee sleeve or brace as told by your health care provider. Remove it only as told by your health care provider. Loosen it if your toes tingle, become numb, or turn cold and blue. Keep it clean. If the knee sleeve or brace is not waterproof: Do not let it get wet. Cover it with a watertight covering when you take a bath or shower.  Activity Rest your knee. Do not do things that cause pain or make pain worse. Avoid high-impact activities or exercises, such as running, jumping rope, or doing jumping jacks. Work with a physical therapist to make a safe exercise program, as recommended by your health care provider. Do exercises as told by your physical therapist. Managing pain, stiffness, and swelling  If directed, put ice on the affected knee. To do this: If you have a removable knee sleeve or brace, remove it as told by your health care provider. Put ice in a plastic bag. Place a towel between your skin and the bag. Leave the ice on for 20 minutes, 2-3 times a day. Remove the ice if your skin turns bright red. This is very important. If you cannot  feel pain, heat, or cold, you have a greater risk of damage to the area. If directed, use an elastic bandage to put pressure (compression) on your injured knee. This may control swelling, give support, and help with discomfort. Raise (elevate) your knee above the level of your heart while you are sitting or lying down. Sleep with a pillow under your knee.  General instructions Take over-the-counter and prescription medicines only as told by your health care provider. Do not use any products that contain nicotine or tobacco, such as cigarettes, e-cigarettes, and chewing tobacco. If you need help quitting, ask your health care provider. If you are overweight, work with your health care provider and a dietitian to set a weight-loss goal that is healthy and reasonable for you. Extra weight can put pressure on your knee. Pay attention to any changes in your symptoms. Keep all follow-up visits. This is important. Contact a health care provider if: Your knee pain continues, changes, or gets worse. You have a fever along with knee pain. Your knee feels warm to the touch or is red. Your knee buckles or locks up. Get help right away if: Your knee swells, and the swelling becomes worse. You cannot move your knee. You have severe pain in your knee that cannot be managed with pain medicine. Summary Acute knee pain can be caused by a fall, an injury, an infection, or damage, swelling,   or irritation of the tissues that support your knee. Your health care provider may perform tests to find out the cause of the pain. Pay attention to any changes in your symptoms. Relieve your pain with rest, medicines, light activity, and the use of ice. Get help right away if your knee swells, you cannot move your knee, or you have severe pain that cannot be managed with medicine. This information is not intended to replace advice given to you by your health care provider. Make sure you discuss any questions you have with  your healthcare provider. Document Revised: 03/04/2020 Document Reviewed: 03/04/2020 Elsevier Patient Education  2022 [employer]  

## 2021-07-06 NOTE — Progress Notes (Signed)
Acute Office Visit  Subjective:    Patient ID: Wanda Murphy, female    DOB: May 16, 1971, 50 y.o.   MRN: [id number]  Chief Complaint  Patient presents with   Knee Pain    Bilateral knee pain. R>L    Knee Pain  The incident occurred at home. The quality of the pain is described as aching. The pain is at a severity of 5/10. The pain is moderate. The pain has been Constant since onset. Associated symptoms include tingling. She reports no foreign bodies present. She has tried nothing for the symptoms.    Past Medical History:  Diagnosis Date   Arthritis     Past Surgical History:  Procedure Laterality Date   TUBAL LIGATION  2004    Family History  Problem Relation Age of Onset   Breast cancer Mother 32   Early death Father        fall with head injury   Diabetes Maternal Grandmother    Stroke Maternal Grandmother    Early death Maternal Grandmother        27s   Heart attack Maternal Grandfather    Early death Paternal Grandfather        farming accident   ADD / ADHD Son    Other Son        joint problems   Mental retardation Daughter     Social History   Socioeconomic History   Marital status: Widowed    Spouse name: Not on file   Number of children: Not on file   Years of education: Not on file   Highest education level: Not on file  Occupational History   Occupation: Surveyor, quantity: [employer]  Tobacco Use   Smoking status: Never   Smokeless tobacco: Never  Vaping Use   Vaping Use: Never used  Substance and Sexual Activity   Alcohol use: Never   Drug use: Never   Sexual activity: Not Currently  Other Topics Concern   Not on file  Social History Narrative   Not on file   Social Determinants of Health   Financial Resource Strain: Not on file  Food Insecurity: Not on file  Transportation Needs: Not on file  Physical Activity: Not on file  Stress: Not on file  Social Connections: Not on file  Intimate Partner Violence: Not on file    No  outpatient medications prior to visit.   No facility-administered medications prior to visit.    No Known Allergies  Review of Systems  Constitutional: Negative.   HENT: Negative.    Eyes: Negative.   Respiratory: Negative.    Gastrointestinal: Negative.   Musculoskeletal: Negative.   Neurological:  Positive for tingling.  All other systems reviewed and are negative.     Objective:    Physical Exam Vitals and nursing note reviewed.  Constitutional:      Appearance: She is obese.  HENT:     Head: Normocephalic.  Eyes:     Conjunctiva/sclera: Conjunctivae normal.  Cardiovascular:     Rate and Rhythm: Normal rate and regular rhythm.     Pulses: Normal pulses.     Heart sounds: Normal heart sounds.  Pulmonary:     Effort: Pulmonary effort is normal.     Breath sounds: Normal breath sounds.  Abdominal:     General: Bowel sounds are normal.  Musculoskeletal:     Right knee: Decreased range of motion. Tenderness present.  Skin:    General: Skin is  warm.     Findings: No rash.  Neurological:     Mental Status: She is alert and oriented to person, place, and time.  Psychiatric:        Mood and Affect: Mood normal.        Behavior: Behavior normal.    BP 127/81   Pulse 94   Wt 267 lb (121.1 kg)   SpO2 98%   BMI 44.43 kg/m  Wt Readings from Last 3 Encounters:  07/06/21 267 lb (121.1 kg)  04/16/21 267 lb 6.4 oz (121.3 kg)  10/24/19 265 lb (120.2 kg)    Health Maintenance Due  Topic Date Due   COVID-19 Vaccine (1) Never done   PAP SMEAR-Modifier  Never done   Zoster Vaccines- Shingrix (1 of 2) Never done   INFLUENZA VACCINE  05/03/2021    There are no preventive care reminders to display for this patient.   Lab Results  Component Value Date   TSH 3.910 04/16/2021   Lab Results  Component Value Date   WBC 6.5 04/16/2021   HGB 15.6 04/16/2021   HCT 47.1 (H) 04/16/2021   MCV 87 04/16/2021   PLT 245 04/16/2021   Lab Results  Component Value Date    NA 139 04/16/2021   K 4.4 04/16/2021   CO2 20 04/16/2021   GLUCOSE 96 04/16/2021   BUN 17 04/16/2021   CREATININE 0.82 04/16/2021   BILITOT 0.3 04/16/2021   ALKPHOS 112 04/16/2021   AST 19 04/16/2021   ALT 17 04/16/2021   PROT 7.5 04/16/2021   ALBUMIN 4.3 04/16/2021   CALCIUM 10.5 (H) 04/16/2021   EGFR 87 04/16/2021   Lab Results  Component Value Date   CHOL 166 04/16/2021   Lab Results  Component Value Date   HDL 39 (L) 04/16/2021   Lab Results  Component Value Date   LDLCALC 86 04/16/2021   Lab Results  Component Value Date   TRIG 246 (H) 04/16/2021   Lab Results  Component Value Date   CHOLHDL 4.3 04/16/2021   Lab Results  Component Value Date   HGBA1C 5.6 04/16/2021       Assessment & Plan:   Problem List Items Addressed This Visit       Other   Acute pain of right knee - Primary    Knee pain unresolved in the past few weeks, ED x-ray of the knee negative,  prednisone taper, ibuprofen, ice or warm compress, elevate joint and stabilize joint, and long term goal- weight loss to take off some pressure form knee. follow up with worsening unresolved symptoms, RX sent to pharmacy.  Education provided printed hand out given      Relevant Medications   predniSONE (STERAPRED UNI-PAK 21 TAB) 10 MG (21) TBPK tablet   ibuprofen (ADVIL) 600 MG tablet     Meds ordered this encounter  Medications   predniSONE (STERAPRED UNI-PAK 21 TAB) 10 MG (21) TBPK tablet    Sig: 60 mg day 1, 50 mg day 2, 40 mg day 3, 30 mg day 4, 20 mg day 5, 10 mg day 6    Dispense:  1 each    Refill:  0    Order Specific Question:   Supervising Provider    Answer:   Janora Norlander [[id number]]   ibuprofen (ADVIL) 600 MG tablet    Sig: Take 1 tablet (600 mg total) by mouth every 8 (eight) hours as needed.    Dispense:  30 tablet  Refill:  0    Order Specific Question:   Supervising Provider    Answer:   Janora Norlander [[id number]]     Ivy Lynn, NP

## 2021-07-06 NOTE — Assessment & Plan Note (Addendum)
Knee pain unresolved in the past few weeks, ED x-ray of the knee negative,  prednisone taper, ibuprofen, ice or warm compress, elevate joint and stabilize joint, and long term goal- weight loss to take off some pressure form knee. follow up with worsening unresolved symptoms, RX sent to pharmacy.  Education provided printed hand out given

## 2021-07-07 ENCOUNTER — Telehealth: Payer: Self-pay | Admitting: Family Medicine

## 2021-07-08 NOTE — Telephone Encounter (Signed)
Pt aware by phone 

## 2021-07-14 DIAGNOSIS — Z20822 Contact with and (suspected) exposure to covid-19: Secondary | ICD-10-CM | POA: Diagnosis not present

## 2021-08-09 ENCOUNTER — Telehealth: Payer: Self-pay

## 2021-08-09 NOTE — Telephone Encounter (Signed)
Called to follow up with the patient as the additional breast imaging needed following her screening mammo has not been rescheduled.  Unable to reach my phone - left a message to call back and sent a message through MyChart.

## 2021-09-15 ENCOUNTER — Telehealth: Payer: Self-pay

## 2021-09-15 ENCOUNTER — Other Ambulatory Visit: Payer: Self-pay

## 2021-09-15 DIAGNOSIS — R928 Other abnormal and inconclusive findings on diagnostic imaging of breast: Secondary | ICD-10-CM

## 2021-09-15 DIAGNOSIS — N6310 Unspecified lump in the right breast, unspecified quadrant: Secondary | ICD-10-CM

## 2021-09-15 NOTE — Telephone Encounter (Signed)
Called patient in regards to additional breast imaging that was recommended on screening mammogram on 06/02/2021. LMTCB - letter has also been mailed to patient.

## 2021-09-16 ENCOUNTER — Other Ambulatory Visit: Payer: Self-pay

## 2021-09-16 DIAGNOSIS — N6310 Unspecified lump in the right breast, unspecified quadrant: Secondary | ICD-10-CM

## 2021-09-16 DIAGNOSIS — R928 Other abnormal and inconclusive findings on diagnostic imaging of breast: Secondary | ICD-10-CM

## 2021-10-06 ENCOUNTER — Telehealth: Payer: Self-pay | Admitting: Family Medicine

## 2021-10-06 NOTE — Telephone Encounter (Signed)
.. °  Medicaid Managed Care  14 Unsuccessful Outreach Note  10/06/2021 Name: Wanda Murphy MRN: [id number] DOB: [birth year]  Referred by: Gwenlyn Fudge, FNP Reason for referral : High Risk Managed Medicaid (I called the patient today to get her scheduled for a phone visit with the MM Team. I left my name and number on her VM.)   An unsuccessful telephone outreach was attempted today. The patient was referred to the case management team for assistance with care management and care coordination.   Follow Up Plan: The care management team will reach out to the patient again over the next 14 days.    Weston Settle Care Guide, High Risk Medicaid Managed Care Embedded Care Coordination [facility]   [facility]

## 2021-10-12 ENCOUNTER — Other Ambulatory Visit: Payer: Self-pay

## 2021-10-12 ENCOUNTER — Ambulatory Visit (HOSPITAL_COMMUNITY)
Admission: RE | Admit: 2021-10-12 | Discharge: 2021-10-12 | Disposition: A | Discharge status: Home / Self Care | Payer: Medicaid Other | Source: Ambulatory Visit | Attending: Family Medicine | Admitting: Family Medicine

## 2021-10-12 DIAGNOSIS — R928 Other abnormal and inconclusive findings on diagnostic imaging of breast: Secondary | ICD-10-CM

## 2021-10-12 DIAGNOSIS — N6310 Unspecified lump in the right breast, unspecified quadrant: Secondary | ICD-10-CM

## 2021-10-12 DIAGNOSIS — R922 Inconclusive mammogram: Secondary | ICD-10-CM | POA: Diagnosis not present

## 2021-10-22 ENCOUNTER — Telehealth: Payer: Self-pay | Admitting: Family Medicine

## 2021-10-22 NOTE — Telephone Encounter (Signed)
Appt made for 10/25/21, pt aware

## 2021-10-25 ENCOUNTER — Encounter: Payer: Self-pay | Admitting: Nurse Practitioner

## 2021-10-25 ENCOUNTER — Ambulatory Visit: Payer: Medicaid Other | Admitting: Nurse Practitioner

## 2021-10-25 VITALS — BP 145/89 | HR 90 | Temp 97.9°F | Ht 65.0 in | Wt 272.2 lb

## 2021-10-25 DIAGNOSIS — M79671 Pain in right foot: Secondary | ICD-10-CM | POA: Diagnosis not present

## 2021-10-25 MED ORDER — DICLOFENAC SODIUM 1 % EX GEL: 2.0000 g | Freq: Four times a day (QID) | CUTANEOUS | 1 refills | Status: DC

## 2021-10-25 NOTE — Patient Instructions (Signed)
Foot Pain °Many things can cause foot pain. Some common causes are: °An injury. °A sprain. °Arthritis. °Blisters. °Bunions. °Follow these instructions at home: °Managing pain, stiffness, and swelling °If directed, put ice on the painful area: °Put ice in a plastic bag. °Place a towel between your skin and the bag. °Leave the ice on for 20 minutes, 2-3 times a day. ° °Activity °Do not stand or walk for long periods. °Return to your normal activities as told by your health care provider. Ask your health care provider what activities are safe for you. °Do stretches to relieve foot pain and stiffness as told by your health care provider. °Do not lift anything that is heavier than 10 lb (4.5 kg), or the limit that you are told, until your health care provider says that it is safe. Lifting a lot of weight can put added pressure on your feet. °Lifestyle °Wear comfortable, supportive shoes that fit you well. Do not wear high heels. °Keep your feet clean and dry. °General instructions °Take over-the-counter and prescription medicines only as told by your health care provider. °Rub your foot gently. °Pay attention to any changes in your symptoms. °Keep all follow-up visits as told by your health care provider. This is important. °Contact a health care provider if: °Your pain does not get better after a few days of self-care. °Your pain gets worse. °You cannot stand on your foot. °Get help right away if: °Your foot is numb or tingling. °Your foot or toes are swollen. °Your foot or toes turn white or blue. °You have warmth and redness along your foot. °Summary °Common causes of foot pain are injury, sprain, arthritis, blisters, or bunions. °Ice, medicines, and comfortable shoes may help foot pain. °Contact your health care provider if your pain does not get better after a few days of self-care. °This information is not intended to replace advice given to you by your health care provider. Make sure you discuss any questions you  have with your health care provider. °Document Revised: 12/23/2020 Document Reviewed: 12/23/2020 °Elsevier Patient Education © 2022 [employer] Inc[employer] ° °

## 2021-10-25 NOTE — Assessment & Plan Note (Signed)
Pain symptoms not well controlled. Patient stands long hours on her job that worsens pain. Pain is reported as 7/10 on right top foot. Advised patient to soak foot in epsom salt after work, Diclofenac 1 % topical cream, continue antiinflammatory as prescribed and follow up with worseing unresolved symptoms.   Referral to podiaetry completed

## 2021-10-25 NOTE — Progress Notes (Signed)
Acute Office Visit  Subjective:    Patient ID: Wanda Murphy, female    DOB: 1971/02/15, 51 y.o.   MRN: [id number]  Chief Complaint  Patient presents with   Foot Pain    HPI Patient is in today for Pain  She reports recurrent right foot pain. was not an injury that may have caused the pain. The pain started a few weeks ago and is staying constant. The pain does not radiate . The pain is described as aching and soreness, is 7/10 in intensity, occurring intermittently. Symptoms are worse in the: afternoon  Aggravating factors: standing Relieving factors: none.  She has tried acetaminophen and oral steroids with little relief.     Past Medical History:  Diagnosis Date   Arthritis     Past Surgical History:  Procedure Laterality Date   TUBAL LIGATION  2004    Family History  Problem Relation Age of Onset   Breast cancer Mother 73   Early death Father        fall with head injury   Diabetes Maternal Grandmother    Stroke Maternal Grandmother    Early death Maternal Grandmother        25s   Heart attack Maternal Grandfather    Early death Paternal Grandfather        farming accident   ADD / ADHD Son    Other Son        joint problems   Mental retardation Daughter     Social History   Socioeconomic History   Marital status: Widowed    Spouse name: Not on file   Number of children: Not on file   Years of education: Not on file   Highest education level: Not on file  Occupational History   Occupation: Surveyor, quantity: [employer]  Tobacco Use   Smoking status: Never   Smokeless tobacco: Never  Vaping Use   Vaping Use: Never used  Substance and Sexual Activity   Alcohol use: Never   Drug use: Never   Sexual activity: Not Currently  Other Topics Concern   Not on file  Social History Narrative   Not on file   Social Determinants of Health   Financial Resource Strain: Not on file  Food Insecurity: Not on file  Transportation Needs: Not on file   Physical Activity: Not on file  Stress: Not on file  Social Connections: Not on file  Intimate Partner Violence: Not on file    Outpatient Medications Prior to Visit  Medication Sig Dispense Refill   ibuprofen (ADVIL) 600 MG tablet Take 1 tablet (600 mg total) by mouth every 8 (eight) hours as needed. 30 tablet 0   predniSONE (STERAPRED UNI-PAK 21 TAB) 10 MG (21) TBPK tablet 60 mg day 1, 50 mg day 2, 40 mg day 3, 30 mg day 4, 20 mg day 5, 10 mg day 6 1 each 0   No facility-administered medications prior to visit.    No Known Allergies  Review of Systems  Constitutional: Negative.   HENT: Negative.    Eyes: Negative.   Respiratory: Negative.    Cardiovascular: Negative.   Genitourinary: Negative.   Skin:  Negative for rash.  All other systems reviewed and are negative.     Objective:    Physical Exam Vitals and nursing note reviewed.  Constitutional:      Appearance: She is obese.  HENT:     Right Ear: External ear normal.  Nose: Nose normal.  Cardiovascular:     Pulses: Normal pulses.     Heart sounds: Normal heart sounds.  Pulmonary:     Effort: Pulmonary effort is normal.     Breath sounds: Normal breath sounds.  Abdominal:     General: Bowel sounds are normal.  Musculoskeletal:     Right lower leg: Tenderness present.  Skin:    Findings: No rash.  Neurological:     Mental Status: She is alert and oriented to person, place, and time.    BP (!) 145/89    Pulse 90    Temp 97.9 F (36.6 C) (Temporal)    Ht '5\' 5"'  (1.651 m)    Wt 272 lb 4 oz (123.5 kg)    BMI 45.30 kg/m  Wt Readings from Last 3 Encounters:  10/25/21 272 lb 4 oz (123.5 kg)  07/06/21 267 lb (121.1 kg)  04/16/21 267 lb 6.4 oz (121.3 kg)    Health Maintenance Due  Topic Date Due   COVID-19 Vaccine (1) Never done   PAP SMEAR-Modifier  Never done   Zoster Vaccines- Shingrix (1 of 2) Never done    There are no preventive care reminders to display for this patient.   Lab Results   Component Value Date   TSH 3.910 04/16/2021   Lab Results  Component Value Date   WBC 6.5 04/16/2021   HGB 15.6 04/16/2021   HCT 47.1 (H) 04/16/2021   MCV 87 04/16/2021   PLT 245 04/16/2021   Lab Results  Component Value Date   NA 139 04/16/2021   K 4.4 04/16/2021   CO2 20 04/16/2021   GLUCOSE 96 04/16/2021   BUN 17 04/16/2021   CREATININE 0.82 04/16/2021   BILITOT 0.3 04/16/2021   ALKPHOS 112 04/16/2021   AST 19 04/16/2021   ALT 17 04/16/2021   PROT 7.5 04/16/2021   ALBUMIN 4.3 04/16/2021   CALCIUM 10.5 (H) 04/16/2021   EGFR 87 04/16/2021   Lab Results  Component Value Date   CHOL 166 04/16/2021   Lab Results  Component Value Date   HDL 39 (L) 04/16/2021   Lab Results  Component Value Date   LDLCALC 86 04/16/2021   Lab Results  Component Value Date   TRIG 246 (H) 04/16/2021   Lab Results  Component Value Date   CHOLHDL 4.3 04/16/2021   Lab Results  Component Value Date   HGBA1C 5.6 04/16/2021       Assessment & Plan:   Problem List Items Addressed This Visit       Other   Right foot pain - Primary    Pain symptoms not well controlled. Patient stands long hours on her job that worsens pain. Pain is reported as 7/10 on right top foot. Advised patient to soak foot in epsom salt after work, Diclofenac 1 % topical cream, continue antiinflammatory as prescribed and follow up with worseing unresolved symptoms.   Referral to podiaetry completed      Relevant Medications   diclofenac Sodium (VOLTAREN) 1 % GEL   Other Relevant Orders   Ambulatory referral to Podiatry     Meds ordered this encounter  Medications   diclofenac Sodium (VOLTAREN) 1 % GEL    Sig: Apply 2 g topically 4 (four) times daily.    Dispense:  50 g    Refill:  1    Order Specific Question:   Supervising Provider    Answer:   [place], [place]  Sissy Hoff, NP

## 2021-10-26 DIAGNOSIS — M542 Cervicalgia: Secondary | ICD-10-CM | POA: Diagnosis not present

## 2021-10-26 DIAGNOSIS — J020 Streptococcal pharyngitis: Secondary | ICD-10-CM | POA: Diagnosis not present

## 2021-10-26 DIAGNOSIS — J029 Acute pharyngitis, unspecified: Secondary | ICD-10-CM | POA: Diagnosis not present

## 2021-10-26 DIAGNOSIS — R0981 Nasal congestion: Secondary | ICD-10-CM | POA: Diagnosis not present

## 2021-10-26 DIAGNOSIS — G44201 Tension-type headache, unspecified, intractable: Secondary | ICD-10-CM | POA: Diagnosis not present

## 2021-10-28 ENCOUNTER — Telehealth: Payer: Self-pay | Admitting: Family Medicine

## 2021-10-28 NOTE — Telephone Encounter (Signed)
.. °  Medicaid Managed Care   Unsuccessful Outreach Note  10/28/2021 Name: Wanda Murphy MRN: [id number] DOB: 1970/11/29  Referred by: Loman Brooklyn, FNP Reason for referral : High Risk Managed Medicaid (I called the patient today to get her scheduled with the MM Team. I left my name and number on her VM.)   A second unsuccessful telephone outreach was attempted today. The patient was referred to the case management team for assistance with care management and care coordination.   Follow Up Plan: The care management team will reach out to the patient again over the next 14 days.    [place]

## 2021-11-05 ENCOUNTER — Ambulatory Visit: Payer: Medicaid Other | Admitting: Podiatry

## 2021-11-18 ENCOUNTER — Telehealth: Payer: Self-pay | Admitting: Family Medicine

## 2021-11-18 NOTE — Telephone Encounter (Signed)
.. °  Medicaid Managed Care   Unsuccessful Outreach Note  11/18/2021 Name: Wanda Murphy MRN: [id number] DOB: 1971-09-13  Referred by: Gwenlyn Fudge, FNP Reason for referral : High Risk Managed Medicaid (Third attempt to reach patient to get her scheduled with the MM Team. I left my name and number on her VM.)   Third unsuccessful telephone outreach was attempted today. The patient was referred to the case management team for assistance with care management and care coordination. The patient's primary care provider has been notified of our unsuccessful attempts to make or maintain contact with the patient. The care management team is pleased to engage with this patient at any time in the future should he/she be interested in assistance from the care management team.   Follow Up Plan: We have been unable to make contact with the patient for follow up. The care management team is available to follow up with the patient after provider conversation with the patient regarding recommendation for care management engagement and subsequent re-referral to the care management team.    Weston Settle Care Guide, High Risk Medicaid Managed Care Embedded Care Coordination [facility]   [facility]

## 2021-12-02 DIAGNOSIS — Z20822 Contact with and (suspected) exposure to covid-19: Secondary | ICD-10-CM | POA: Diagnosis not present

## 2021-12-02 DIAGNOSIS — M791 Myalgia, unspecified site: Secondary | ICD-10-CM | POA: Diagnosis not present

## 2021-12-02 DIAGNOSIS — A084 Viral intestinal infection, unspecified: Secondary | ICD-10-CM | POA: Diagnosis not present

## 2022-01-17 DIAGNOSIS — M25562 Pain in left knee: Secondary | ICD-10-CM | POA: Diagnosis not present

## 2022-03-16 DIAGNOSIS — B349 Viral infection, unspecified: Secondary | ICD-10-CM | POA: Diagnosis not present

## 2022-04-06 DIAGNOSIS — L03032 Cellulitis of left toe: Secondary | ICD-10-CM | POA: Diagnosis not present

## 2022-04-23 DIAGNOSIS — M79672 Pain in left foot: Secondary | ICD-10-CM | POA: Diagnosis not present

## 2022-04-23 DIAGNOSIS — M7989 Other specified soft tissue disorders: Secondary | ICD-10-CM | POA: Diagnosis not present

## 2022-05-30 DIAGNOSIS — U071 COVID-19: Secondary | ICD-10-CM | POA: Diagnosis not present

## 2022-05-30 DIAGNOSIS — R5383 Other fatigue: Secondary | ICD-10-CM | POA: Diagnosis not present

## 2022-06-22 ENCOUNTER — Ambulatory Visit (INDEPENDENT_AMBULATORY_CARE_PROVIDER_SITE_OTHER): Payer: Medicaid Other | Admitting: Family Medicine

## 2022-06-22 ENCOUNTER — Encounter: Payer: Self-pay | Admitting: Family Medicine

## 2022-06-22 VITALS — BP 161/104 | HR 90 | Temp 97.4°F | Resp 20 | Ht 65.0 in | Wt 270.0 lb

## 2022-06-22 DIAGNOSIS — Z0001 Encounter for general adult medical examination with abnormal findings: Secondary | ICD-10-CM

## 2022-06-22 DIAGNOSIS — R030 Elevated blood-pressure reading, without diagnosis of hypertension: Secondary | ICD-10-CM | POA: Diagnosis not present

## 2022-06-22 DIAGNOSIS — R209 Unspecified disturbances of skin sensation: Secondary | ICD-10-CM

## 2022-06-22 DIAGNOSIS — M25572 Pain in left ankle and joints of left foot: Secondary | ICD-10-CM

## 2022-06-22 DIAGNOSIS — M25571 Pain in right ankle and joints of right foot: Secondary | ICD-10-CM | POA: Diagnosis not present

## 2022-06-22 DIAGNOSIS — G8929 Other chronic pain: Secondary | ICD-10-CM | POA: Diagnosis not present

## 2022-06-22 DIAGNOSIS — M722 Plantar fascial fibromatosis: Secondary | ICD-10-CM

## 2022-06-22 MED ORDER — PREDNISONE 10 MG (21) PO TBPK: ORAL_TABLET | ORAL | 0 refills | Status: DC

## 2022-06-22 MED ORDER — MELOXICAM 7.5 MG PO TABS: 7.5000 mg | ORAL_TABLET | Freq: Every day | ORAL | 2 refills | Status: DC
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
## 2022-06-22 NOTE — Patient Instructions (Addendum)
Compression hose to be applied first thing in the morning and removed at night.   Steroids for now.  Start daily meloxicam for pain.  Schedule an appointment with the podiatrist.

## 2022-07-03 DIAGNOSIS — Z419 Encounter for procedure for purposes other than remedying health state, unspecified: Secondary | ICD-10-CM | POA: Diagnosis not present

## 2022-07-19 IMAGING — US US BREAST*R* LIMITED INC AXILLA
1 series · 8 of 8 positions shown · non-contrast
Comparison: Screening mammogram dated 06/02/2021.

CLINICAL DATA: Screening recall from baseline mammography for right
breast mass.

EXAM:
DIGITAL DIAGNOSTIC UNILATERAL RIGHT MAMMOGRAM WITH TOMOSYNTHESIS AND
CAD; ULTRASOUND RIGHT BREAST LIMITED
TECHNIQUE: Right digital diagnostic mammography and breast tomosynthesis was
performed. The images were evaluated with computer-aided detection.;
Targeted ultrasound examination of the right breast was performed

[Series 1: us breast*right* limited inc axilla · 0.07mm/px · 8 of 8 slices shown]
[im 1/8]
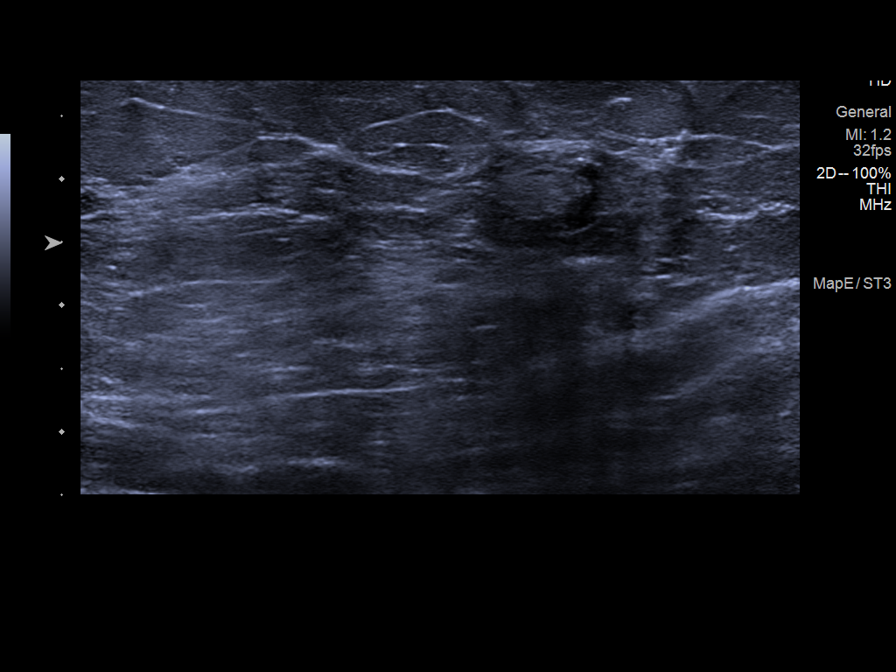
[im 2/8]
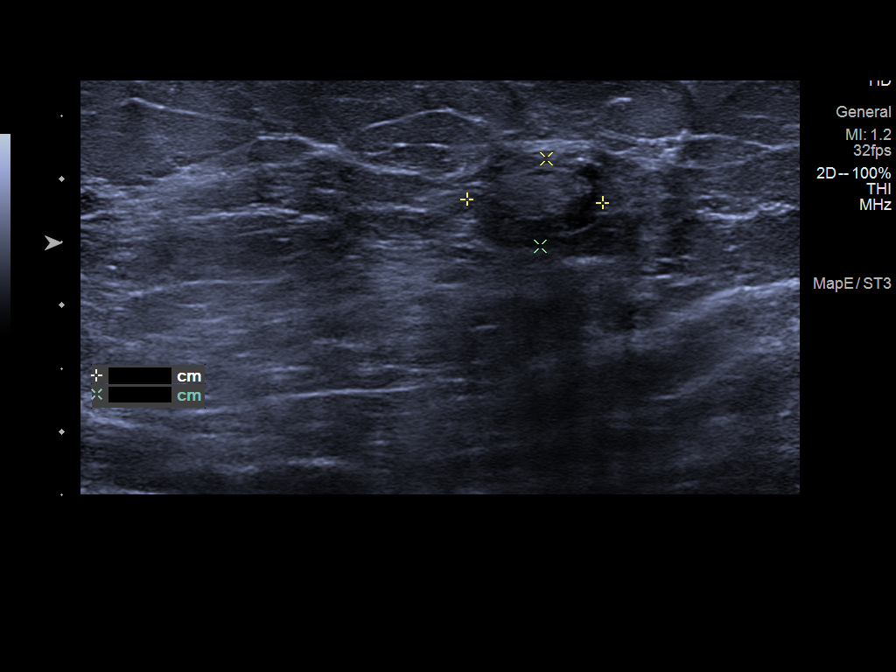
[im 3/8]
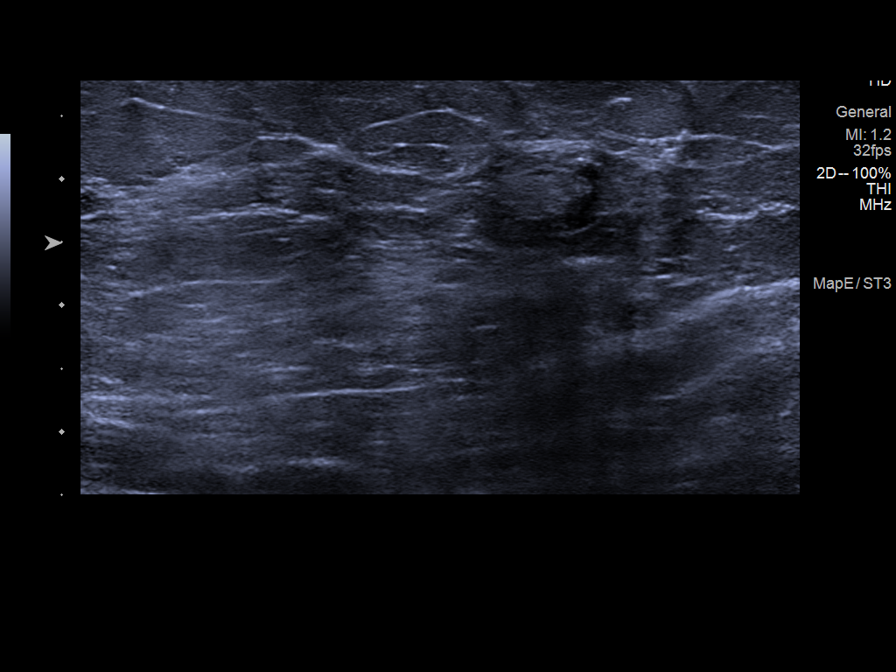
[im 4/8]
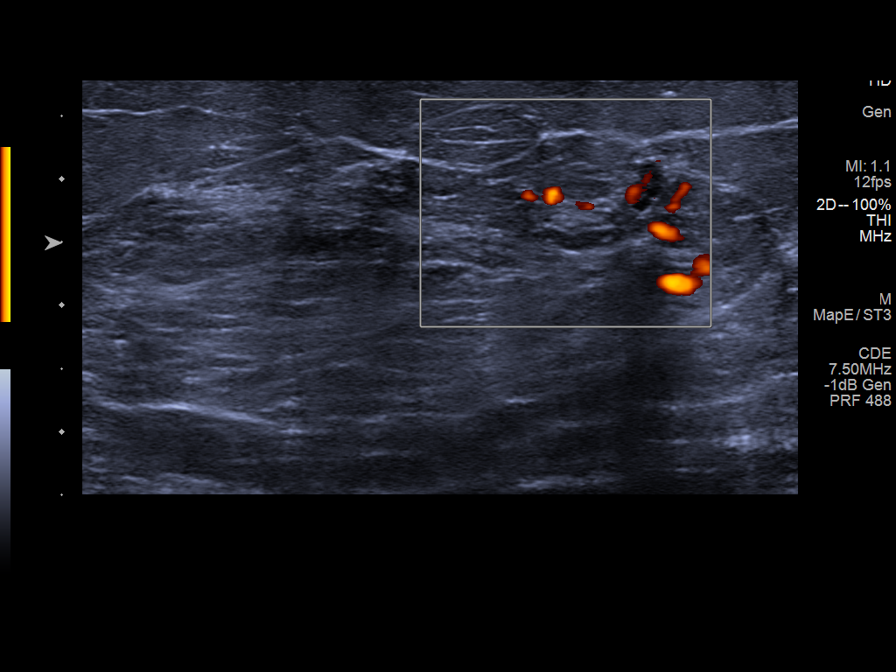
[im 5/8]
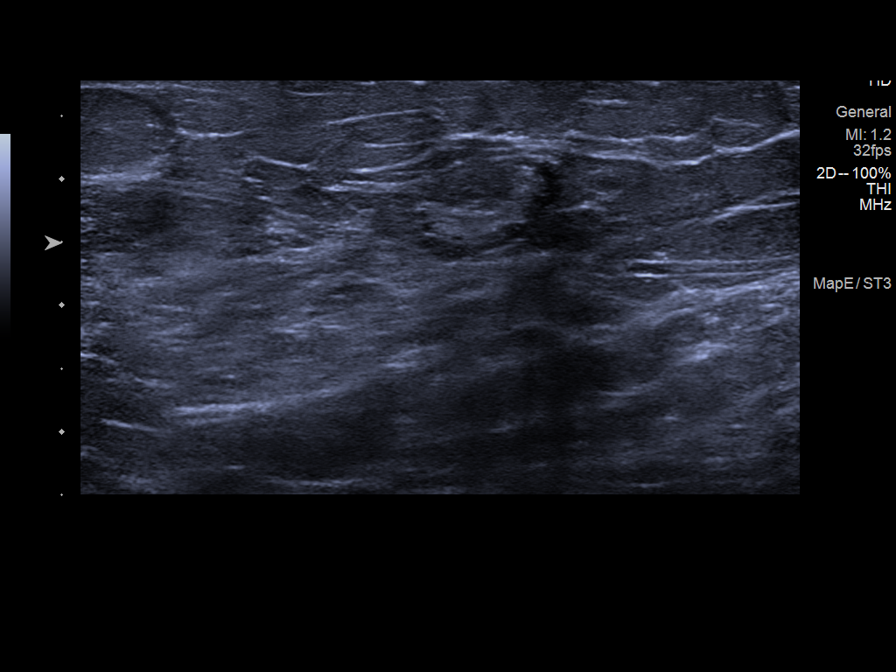
[im 6/8]
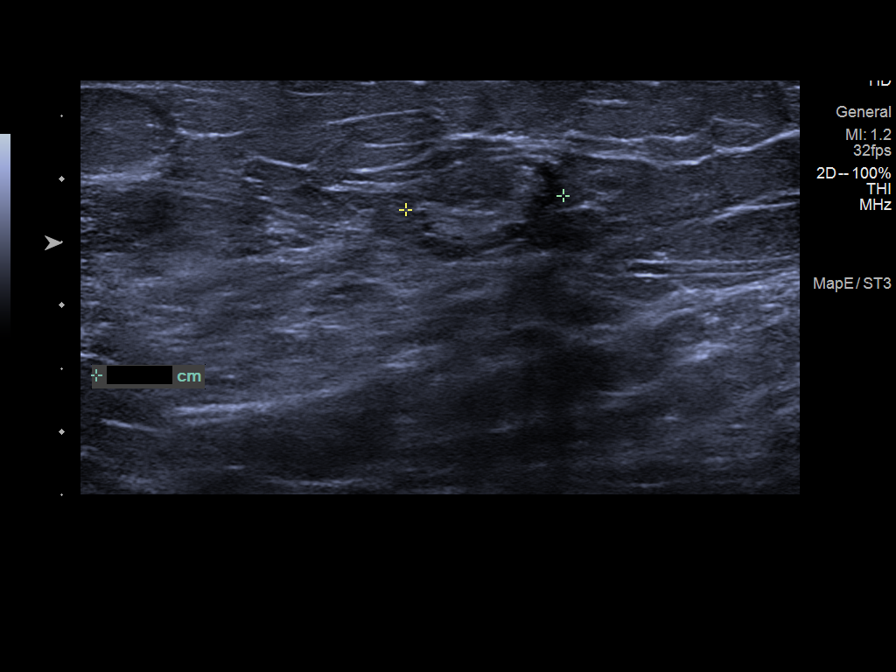
[im 7/8]
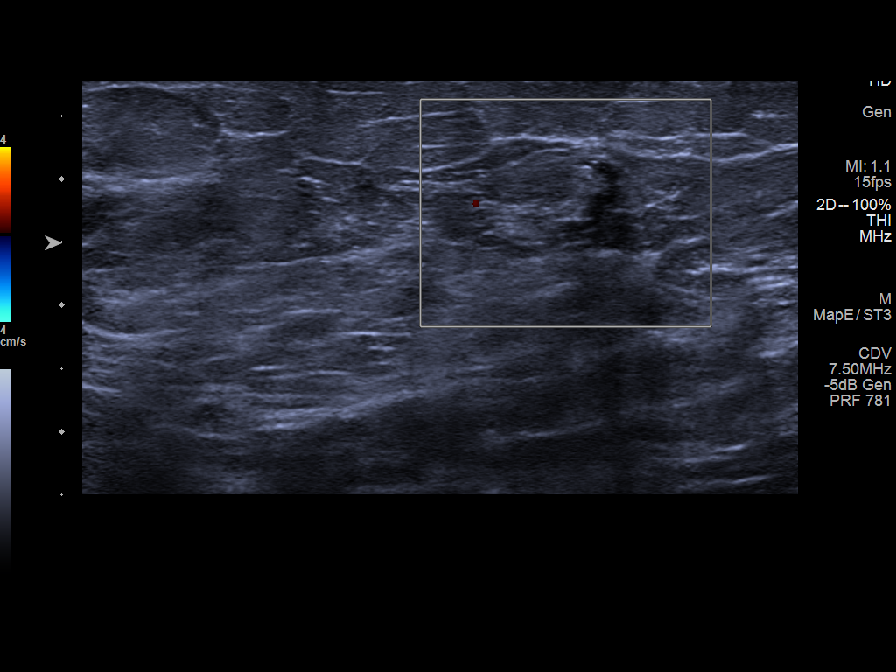
[im 8/8]
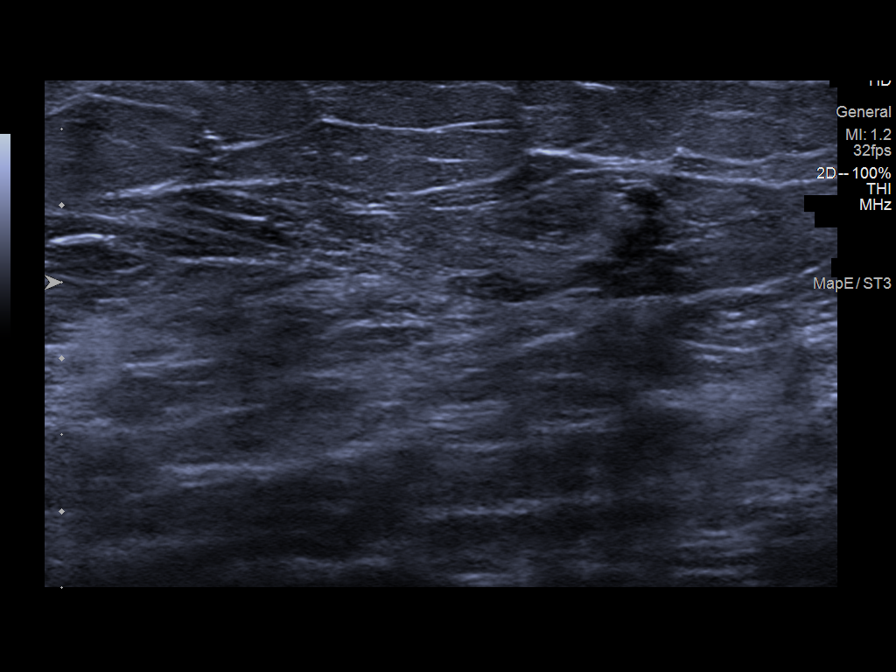

[8 of 8 positions shown; findings below may reference images not displayed]

ACR Breast Density Category b: There are scattered areas of
fibroglandular density.
FINDINGS: Spot compression tomograms were performed of the right breast. There
is an oval circumscribed mass in the upper-outer right breast
measuring approximately 1 cm.

Targeted ultrasound of the upper-outer right breast was performed.
There is a lymph node in the right breast at 10 o'clock 7 cm from
nipple measuring 1.1 x 0.7 by 1.2 cm. This corresponds well with the
mass seen in the right breast at mammography.
IMPRESSION: No findings of malignancy in the right breast.

RECOMMENDATION:
Recommend annual screening mammography, due June 2022.

I have discussed the findings and recommendations with the patient.
If applicable, a reminder letter will be sent to the patient
regarding the next appointment.

BI-RADS CATEGORY  2: Benign.

## 2022-07-19 IMAGING — MG MM DIGITAL DIAGNOSTIC UNILAT*R* W/ TOMO W/ CAD
6 series · 6 of 18 positions shown · non-contrast
Comparison: Screening mammogram dated 06/02/2021.

CLINICAL DATA: Screening recall from baseline mammography for right
breast mass.

EXAM:
DIGITAL DIAGNOSTIC UNILATERAL RIGHT MAMMOGRAM WITH TOMOSYNTHESIS AND
CAD; ULTRASOUND RIGHT BREAST LIMITED
TECHNIQUE: Right digital diagnostic mammography and breast tomosynthesis was
performed. The images were evaluated with computer-aided detection.;
Targeted ultrasound examination of the right breast was performed

[R MLO synth-2D (1 of 2)]
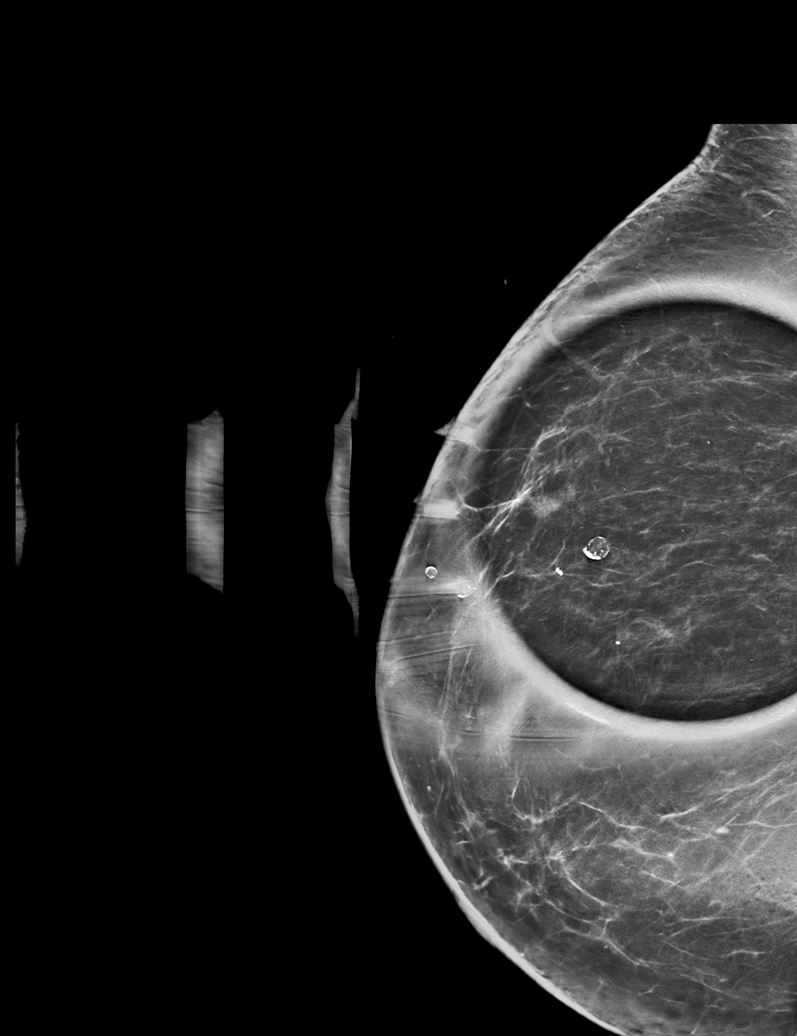

[R CC synth-2D]
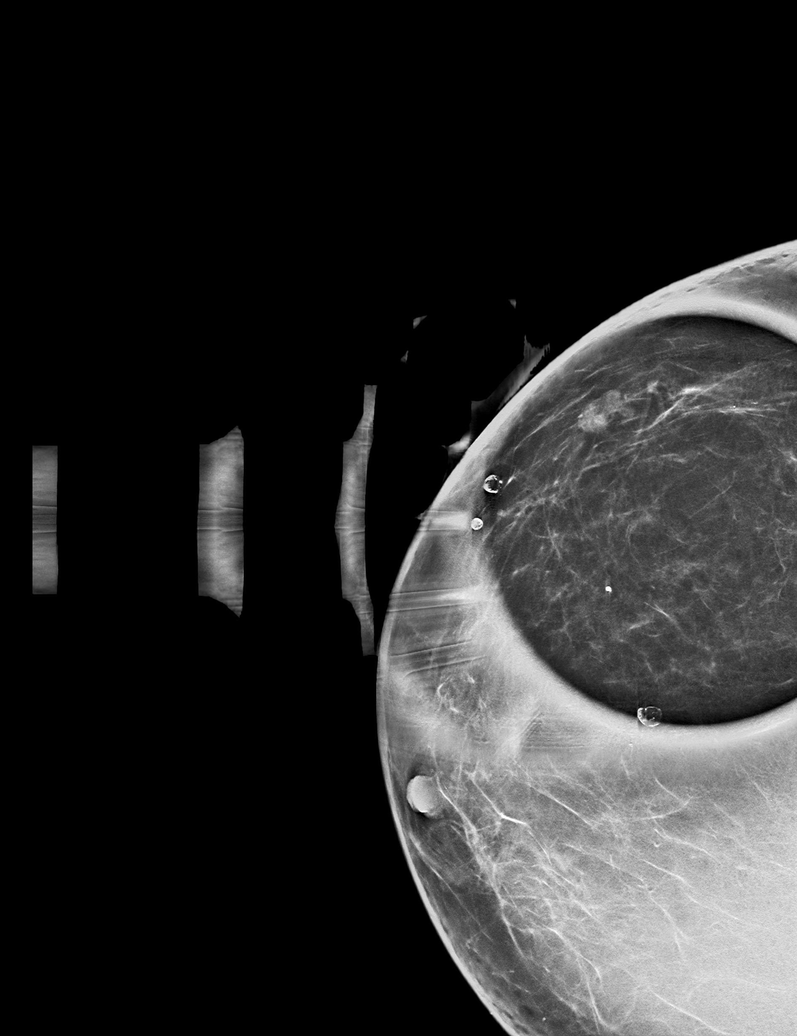

[R MLO synth-2D (2 of 2)]
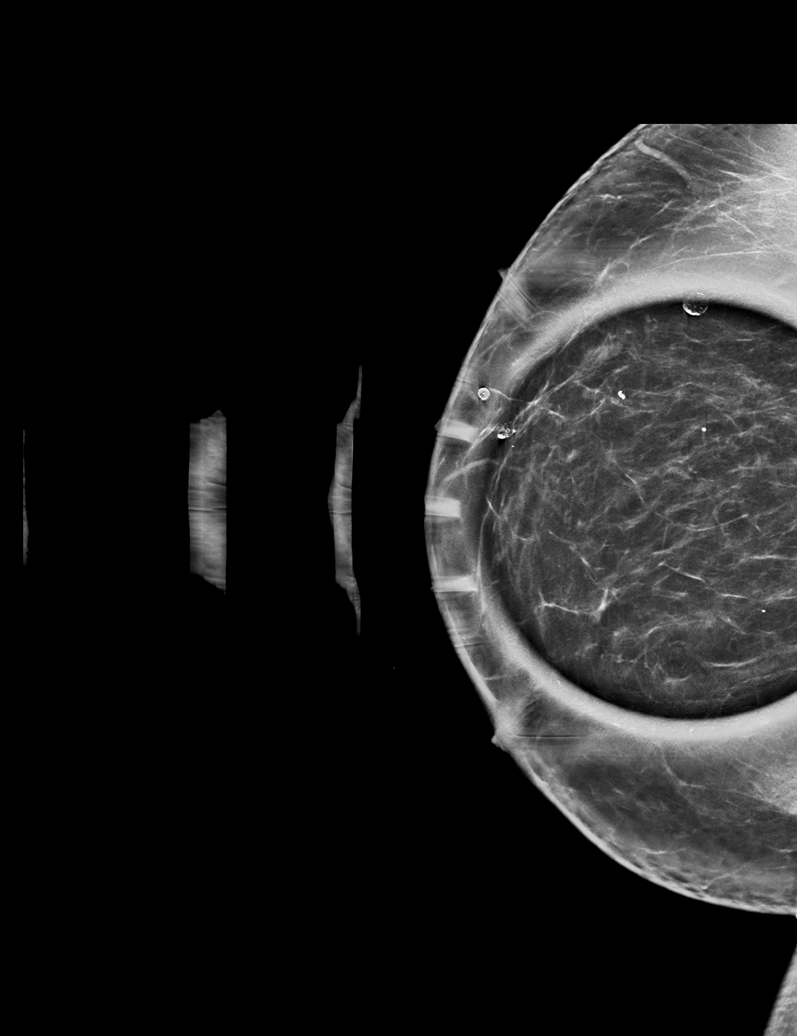

[R MLO tomo (1 of 2) · tomo slice 39/76.0]
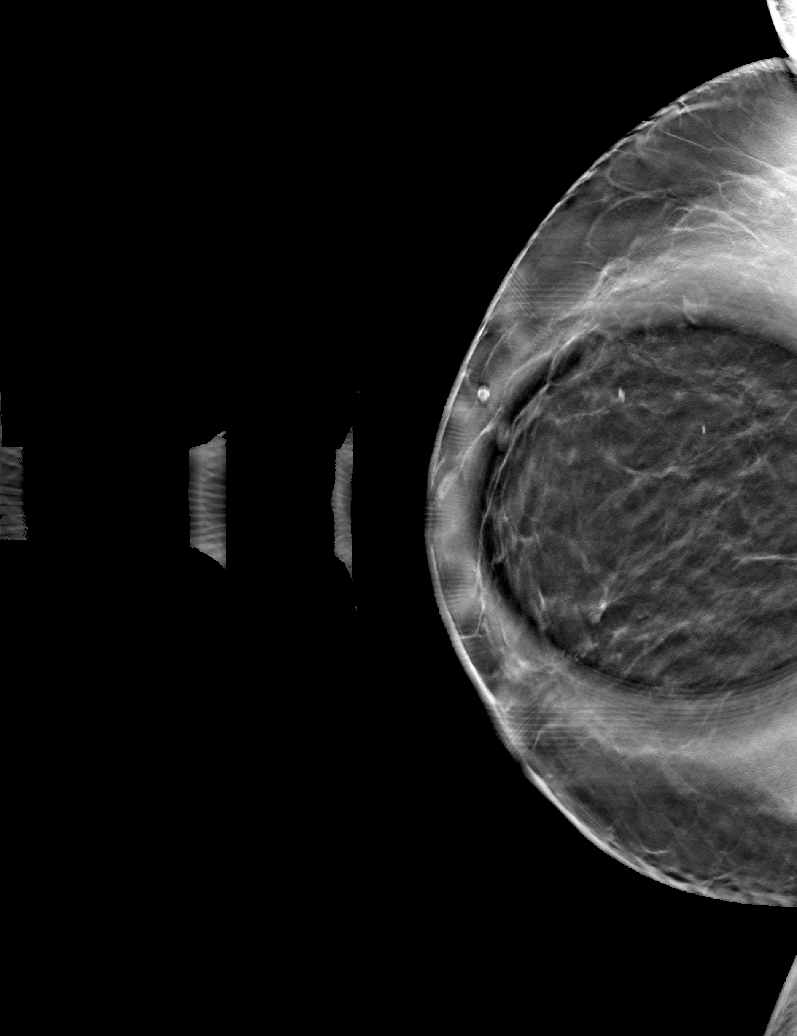

[R MLO tomo (2 of 2) · tomo slice 39/78.0]
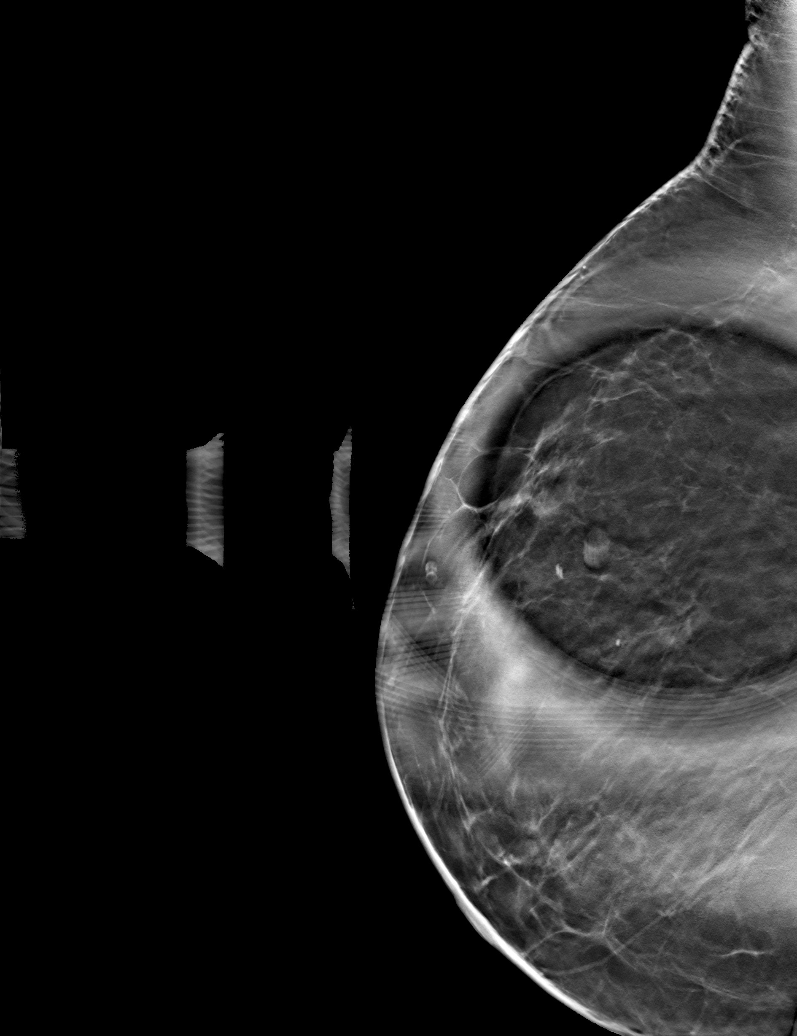

[R CC tomo · tomo slice 32/63.0]
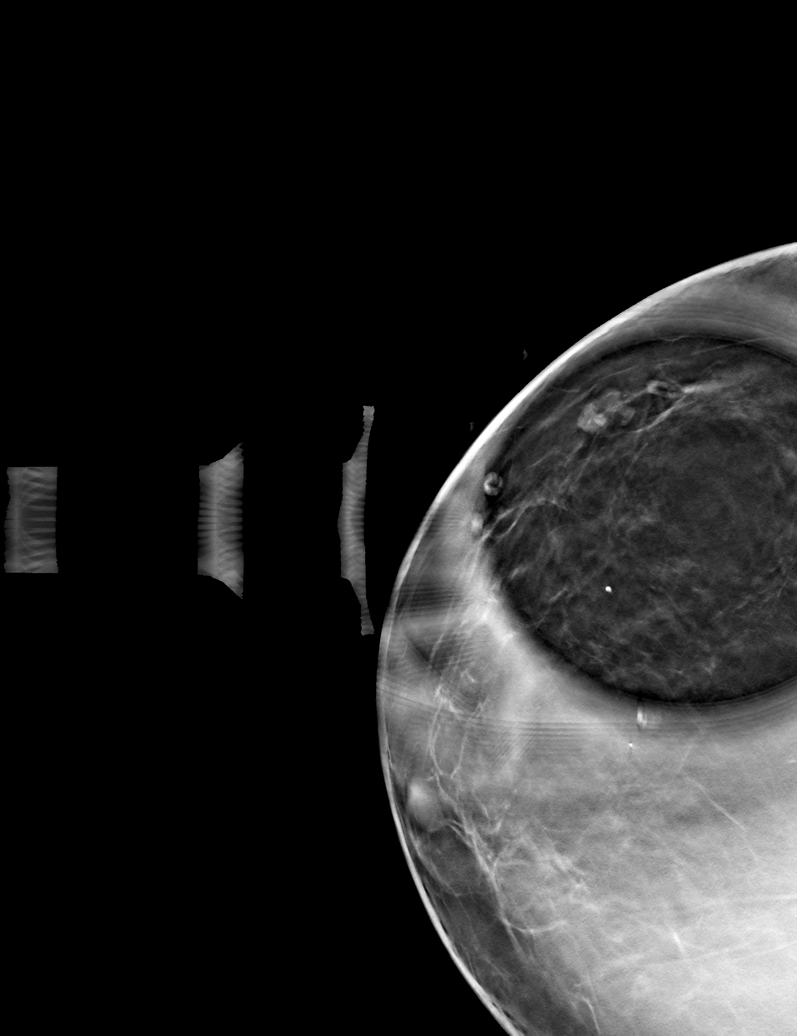

[6 of 18 positions shown; findings below may reference images not displayed]

ACR Breast Density Category b: There are scattered areas of
fibroglandular density.
FINDINGS: Spot compression tomograms were performed of the right breast. There
is an oval circumscribed mass in the upper-outer right breast
measuring approximately 1 cm.

Targeted ultrasound of the upper-outer right breast was performed.
There is a lymph node in the right breast at 10 o'clock 7 cm from
nipple measuring 1.1 x 0.7 by 1.2 cm. This corresponds well with the
mass seen in the right breast at mammography.
IMPRESSION: No findings of malignancy in the right breast.

RECOMMENDATION:
Recommend annual screening mammography, due June 2022.

I have discussed the findings and recommendations with the patient.
If applicable, a reminder letter will be sent to the patient
regarding the next appointment.

BI-RADS CATEGORY  2: Benign.

## 2022-07-20 ENCOUNTER — Encounter: Payer: Self-pay | Admitting: Family Medicine

## 2022-07-20 ENCOUNTER — Ambulatory Visit (INDEPENDENT_AMBULATORY_CARE_PROVIDER_SITE_OTHER): Payer: Medicaid Other | Admitting: Family Medicine

## 2022-07-20 VITALS — BP 151/101 | HR 96 | Temp 97.7°F | Ht 65.0 in | Wt 268.2 lb

## 2022-07-20 DIAGNOSIS — I10 Essential (primary) hypertension: Secondary | ICD-10-CM | POA: Diagnosis not present

## 2022-07-20 DIAGNOSIS — M25471 Effusion, right ankle: Secondary | ICD-10-CM | POA: Diagnosis not present

## 2022-07-20 DIAGNOSIS — Z6841 Body Mass Index (BMI) 40.0 and over, adult: Secondary | ICD-10-CM | POA: Insufficient documentation

## 2022-07-20 DIAGNOSIS — M25472 Effusion, left ankle: Secondary | ICD-10-CM

## 2022-07-20 MED ORDER — CHLORTHALIDONE 25 MG PO TABS: 25.0000 mg | ORAL_TABLET | Freq: Every day | ORAL | 1 refills | Status: DC

## 2022-07-20 NOTE — Patient Instructions (Signed)

## 2022-07-20 NOTE — Progress Notes (Signed)
Subjective:  Patient ID: Wanda Murphy, female    DOB: 09/07/1971, 51 y.o.   MRN: [id number]  Patient Care Team: Baruch Gouty, FNP as PCP - General (Family Medicine)   Chief Complaint:  Establish Care Blanch Media patient ) and Follow-up (4 week follow up-feet, ankles, BP.)   HPI: Wanda Murphy is a 51 y.o. female presenting on 07/20/2022 for Establish Care Blanch Media patient ) and Follow-up (4 week follow up-feet, ankles, BP.)   Patient presents today for follow-up of bilateral feet and ankle pain and hypertension.  She was seen approximately 4 weeks ago by Delight Ovens, NP.  Blood pressure was noted to be in the 160/100 range.  Lifestyle changes were discussed and patient was to follow-up for further evaluation.  She was also placed on meloxicam for ongoing feet and ankle pain.  Patient states she has been taking this with some relief of pain but not complete resolution.  She states she now has slight swelling in her ankles as well.  Upon chart review is noted the patient has had several documented episodes of hypertension over the last several years.  She denies prior diagnosis of hypertension or work-up for hypertension.  She does report intermittent headaches at times.  No confusion or weakness.  She denies chest pain, palpitations, shortness of breath, dizziness, or syncope.   Relevant past medical, surgical, family, and social history reviewed and updated as indicated.  Allergies and medications reviewed and updated. Data reviewed: Chart in Epic.   Past Medical History:  Diagnosis Date   Arthritis     Past Surgical History:  Procedure Laterality Date   TUBAL LIGATION  2004    Social History   Socioeconomic History   Marital status: Widowed    Spouse name: Not on file   Number of children: Not on file   Years of education: Not on file   Highest education level: Not on file  Occupational History   Occupation: Surveyor, quantity: [employer]  Tobacco Use   Smoking  status: Never   Smokeless tobacco: Never  Vaping Use   Vaping Use: Never used  Substance and Sexual Activity   Alcohol use: Never   Drug use: Never   Sexual activity: Not Currently  Other Topics Concern   Not on file  Social History Narrative   Not on file   Social Determinants of Health   Financial Resource Strain: Not on file  Food Insecurity: Not on file  Transportation Needs: Not on file  Physical Activity: Not on file  Stress: Not on file  Social Connections: Not on file  Intimate Partner Violence: Not on file    Outpatient Encounter Medications as of 07/20/2022  Medication Sig   chlorthalidone (HYGROTON) 25 MG tablet Take 1 tablet (25 mg total) by mouth daily.   meloxicam (MOBIC) 7.5 MG tablet Take 1 tablet (7.5 mg total) by mouth daily.   [DISCONTINUED] predniSONE (STERAPRED UNI-PAK 21 TAB) 10 MG (21) TBPK tablet As directed x 6 days   No facility-administered encounter medications on file as of 07/20/2022.    No Known Allergies  Review of Systems  Constitutional:  Negative for activity change, appetite change, chills, diaphoresis, fatigue, fever and unexpected weight change.  HENT: Negative.    Eyes: Negative.  Negative for photophobia and visual disturbance.  Respiratory:  Negative for cough, chest tightness and shortness of breath.   Cardiovascular:  Positive for leg swelling. Negative for chest pain and palpitations.  Gastrointestinal:  Negative for abdominal pain, blood in stool, constipation, diarrhea, nausea and vomiting.  Endocrine: Negative.  Negative for cold intolerance, heat intolerance, polydipsia, polyphagia and polyuria.  Genitourinary:  Negative for decreased urine volume, difficulty urinating, dysuria, frequency and urgency.  Musculoskeletal:  Positive for arthralgias and myalgias.  Skin: Negative.   Allergic/Immunologic: Negative.   Neurological:  Positive for headaches. Negative for dizziness, tremors, seizures, syncope, facial asymmetry,  speech difficulty, weakness, light-headedness and numbness.  Hematological: Negative.   Psychiatric/Behavioral:  Negative for confusion, hallucinations, sleep disturbance and suicidal ideas.   All other systems reviewed and are negative.       Objective:  BP (!) 151/101   Pulse 96   Temp 97.7 F (36.5 C) (Temporal)   Ht 5' 5" (1.651 m)   Wt 268 lb 3.2 oz (121.7 kg)   SpO2 98%   BMI 44.63 kg/m    Wt Readings from Last 3 Encounters:  07/20/22 268 lb 3.2 oz (121.7 kg)  06/22/22 270 lb (122.5 kg)  10/25/21 272 lb 4 oz (123.5 kg)    Physical Exam Vitals and nursing note reviewed.  Constitutional:      General: She is not in acute distress.    Appearance: Normal appearance. She is well-developed and well-groomed. She is morbidly obese. She is not ill-appearing, toxic-appearing or diaphoretic.  HENT:     Head: Normocephalic and atraumatic.     Jaw: There is normal jaw occlusion.     Right Ear: Hearing normal.     Left Ear: Hearing normal.     Nose: Nose normal.     Mouth/Throat:     Lips: Pink.     Mouth: Mucous membranes are moist.     Pharynx: Oropharynx is clear. Uvula midline.  Eyes:     General: Lids are normal.     Extraocular Movements: Extraocular movements intact.     Conjunctiva/sclera: Conjunctivae normal.     Pupils: Pupils are equal, round, and reactive to light.  Neck:     Thyroid: No thyroid mass, thyromegaly or thyroid tenderness.     Vascular: No carotid bruit or JVD.     Trachea: Trachea and phonation normal.  Cardiovascular:     Rate and Rhythm: Normal rate and regular rhythm.     Chest Wall: PMI is not displaced.     Pulses: Normal pulses.     Heart sounds: Normal heart sounds. No murmur heard.    No friction rub. No gallop.  Pulmonary:     Effort: Pulmonary effort is normal. No respiratory distress.     Breath sounds: Normal breath sounds. No wheezing.  Abdominal:     General: Bowel sounds are normal. There is no distension or abdominal bruit.      Palpations: Abdomen is soft. There is no hepatomegaly or splenomegaly.     Tenderness: There is no abdominal tenderness. There is no right CVA tenderness or left CVA tenderness.     Hernia: No hernia is present.  Musculoskeletal:        General: Normal range of motion.     Cervical back: Normal range of motion and neck supple.     Right lower leg: Normal. No edema.     Left lower leg: Normal. No edema.     Right ankle: Swelling present. No deformity, ecchymosis or lacerations. No tenderness. Normal range of motion. Anterior drawer test negative. Normal pulse.     Left ankle: Swelling present. No deformity, ecchymosis or lacerations. No tenderness. Normal  range of motion. Anterior drawer test negative. Normal pulse.     Right foot: Normal range of motion. Swelling present. No deformity, bunion, Charcot foot, foot drop, prominent metatarsal heads, laceration, tenderness, bony tenderness or crepitus.     Left foot: Normal range of motion. Swelling present. No deformity, bunion, Charcot foot, foot drop, prominent metatarsal heads, laceration, tenderness, bony tenderness or crepitus.  Lymphadenopathy:     Cervical: No cervical adenopathy.  Skin:    General: Skin is warm and dry.     Capillary Refill: Capillary refill takes less than 2 seconds.     Coloration: Skin is not cyanotic, jaundiced or pale.     Findings: No rash.  Neurological:     General: No focal deficit present.     Mental Status: She is alert and oriented to person, place, and time.     Sensory: Sensation is intact.     Motor: Motor function is intact.     Coordination: Coordination is intact.     Gait: Gait is intact.     Deep Tendon Reflexes: Reflexes are normal and symmetric.  Psychiatric:        Attention and Perception: Attention and perception normal.        Mood and Affect: Mood and affect normal.        Speech: Speech normal.        Behavior: Behavior normal. Behavior is cooperative.        Thought Content:  Thought content normal.        Cognition and Memory: Cognition and memory normal.        Judgment: Judgment normal.     Results for orders placed or performed in visit on 04/16/21  Anemia Profile B  Result Value Ref Range   Total Iron Binding Capacity 285 250 - 450 ug/dL   UIBC 219 131 - 425 ug/dL   Iron 66 27 - 159 ug/dL   Iron Saturation 23 15 - 55 %   Ferritin 78 15 - 150 ng/mL   Vitamin B-12 378 232 - 1,245 pg/mL   Folate 5.2 >3.0 ng/mL   WBC 6.5 3.4 - 10.8 x10E3/uL   RBC 5.39 (H) 3.77 - 5.28 x10E6/uL   Hemoglobin 15.6 11.1 - 15.9 g/dL   Hematocrit 47.1 (H) 34.0 - 46.6 %   MCV 87 79 - 97 fL   MCH 28.9 26.6 - 33.0 pg   MCHC 33.1 31.5 - 35.7 g/dL   RDW 13.6 11.7 - 15.4 %   Platelets 245 150 - 450 x10E3/uL   Neutrophils 65 Not Estab. %   Lymphs 23 Not Estab. %   Monocytes 9 Not Estab. %   Eos 2 Not Estab. %   Basos 1 Not Estab. %   Neutrophils Absolute 4.3 1.4 - 7.0 x10E3/uL   Lymphocytes Absolute 1.5 0.7 - 3.1 x10E3/uL   Monocytes Absolute 0.6 0.1 - 0.9 x10E3/uL   EOS (ABSOLUTE) 0.1 0.0 - 0.4 x10E3/uL   Basophils Absolute 0.1 0.0 - 0.2 x10E3/uL   Immature Granulocytes 0 Not Estab. %   Immature Grans (Abs) 0.0 0.0 - 0.1 x10E3/uL   Retic Ct Pct 1.6 0.6 - 2.6 %  CMP14+EGFR  Result Value Ref Range   Glucose 96 65 - 99 mg/dL   BUN 17 6 - 24 mg/dL   Creatinine, Ser 0.82 0.57 - 1.00 mg/dL   eGFR 87 >59 mL/min/1.73   BUN/Creatinine Ratio 21 9 - 23   Sodium 139 134 - 144 mmol/L  Potassium 4.4 3.5 - 5.2 mmol/L   Chloride 103 96 - 106 mmol/L   CO2 20 20 - 29 mmol/L   Calcium 10.5 (H) 8.7 - 10.2 mg/dL   Total Protein 7.5 6.0 - 8.5 g/dL   Albumin 4.3 3.8 - 4.9 g/dL   Globulin, Total 3.2 1.5 - 4.5 g/dL   Albumin/Globulin Ratio 1.3 1.2 - 2.2   Bilirubin Total 0.3 0.0 - 1.2 mg/dL   Alkaline Phosphatase 112 44 - 121 IU/L   AST 19 0 - 40 IU/L   ALT 17 0 - 32 IU/L  Lipid panel  Result Value Ref Range   Cholesterol, Total 166 100 - 199 mg/dL   Triglycerides 246 (H) 0 - 149  mg/dL   HDL 39 (L) >39 mg/dL   VLDL Cholesterol Cal 41 (H) 5 - 40 mg/dL   LDL Chol Calc (NIH) 86 0 - 99 mg/dL   Chol/HDL Ratio 4.3 0.0 - 4.4 ratio  TSH  Result Value Ref Range   TSH 3.910 0.450 - 4.500 uIU/mL  Bayer DCA Hb A1c Waived  Result Value Ref Range   HB A1C (BAYER DCA - WAIVED) 5.6 <7.0 %     EKG: SR 98, PR 180 ms, QT 348 ms, incomplete BBB, no acute ST-T changes, no ectopy. No prior EKG for comparison. Monia Pouch, FNP-C  Pertinent labs & imaging results that were available during my care of the patient were reviewed by me and considered in my medical decision making.  Assessment & Plan:  [place] was seen today for establish care and follow-up.  Diagnoses and all orders for this visit:  Primary hypertension Several documented episodes of hypertension in EHR.  Hypertensive in office today with bilateral lower extremity edema.  Will obtain below labs for initial hypertensive work-up.  EKG without acute findings in office.  Will initiate chlorthalidone as prescribed. DASH diet and exercise discussed in detail.  Patient to follow-up in office in 2 weeks for reevaluation.       -     CMP14+EGFR -     Lipid panel -     Thyroid Panel With TSH -     Brain natriuretic peptide -     EKG 12-Lead -     chlorthalidone (HYGROTON) 25 MG tablet; Take 1 tablet (25 mg total) by mouth daily.  Swelling of both ankles Labs pending.  Discussed limiting sodium intake and elevating feet when sitting.  Chlorthalidone as prescribed. -     CMP14+EGFR -     Thyroid Panel With TSH -     Brain natriuretic peptide -     chlorthalidone (HYGROTON) 25 MG tablet; Take 1 tablet (25 mg total) by mouth daily.  BMI 40.0-44.9, adult (HCC) Diet and exercise encouraged.  Labs pending. -     CBC with Differential/Platelet -     CMP14+EGFR -     Lipid panel -     Thyroid Panel With TSH     Continue all other maintenance medications.  Follow up plan: Return in about 2 weeks (around 08/03/2022), or if  symptoms worsen or fail to improve, for HTN, BMP.   Continue healthy lifestyle choices, including diet (rich in fruits, vegetables, and lean proteins, and low in salt and simple carbohydrates) and exercise (at least 30 minutes of moderate physical activity daily).  Educational handout given for DASH diet, HTN  The above assessment and management plan was discussed with the patient. The patient verbalized understanding of and has agreed to the  management plan. Patient is aware to call the clinic if they develop any new symptoms or if symptoms persist or worsen. Patient is aware when to return to the clinic for a follow-up visit. Patient educated on when it is appropriate to go to the emergency department.   Monia Pouch, FNP-C [facility] [phone]

## 2022-07-22 LAB — CMP14+EGFR
ALT: 17 IU/L (ref 0–32)
AST: 22 IU/L (ref 0–40)
Albumin/Globulin Ratio: 1.4 (ref 1.2–2.2)
Albumin: 4.4 g/dL (ref 3.8–4.9)
Alkaline Phosphatase: 112 IU/L (ref 44–121)
BUN/Creatinine Ratio: 19 (ref 9–23)
BUN: 16 mg/dL (ref 6–24)
Bilirubin Total: 0.3 mg/dL (ref 0.0–1.2)
CO2: 22 mmol/L (ref 20–29)
Calcium: 10.2 mg/dL (ref 8.7–10.2)
Chloride: 102 mmol/L (ref 96–106)
Creatinine, Ser: 0.83 mg/dL (ref 0.57–1.00)
Globulin, Total: 3.2 g/dL (ref 1.5–4.5)
Glucose: 137 mg/dL — ABNORMAL HIGH (ref 70–99)
Potassium: 4 mmol/L (ref 3.5–5.2)
Sodium: 141 mmol/L (ref 134–144)
Total Protein: 7.6 g/dL (ref 6.0–8.5)
eGFR: 85 mL/min/{1.73_m2} (ref >59)

## 2022-07-22 LAB — CBC WITH DIFFERENTIAL/PLATELET
Basophils Absolute: 0 10*3/uL (ref 0.0–0.2)
Basos: 1 %
EOS (ABSOLUTE): 0.2 10*3/uL (ref 0.0–0.4)
Eos: 3 %
Hematocrit: 46.9 % — ABNORMAL HIGH (ref 34.0–46.6)
Hemoglobin: 15.5 g/dL (ref 11.1–15.9)
Immature Grans (Abs): 0 10*3/uL (ref 0.0–0.1)
Immature Granulocytes: 0 %
Lymphocytes Absolute: 1.7 10*3/uL (ref 0.7–3.1)
Lymphs: 27 %
MCH: 28.9 pg (ref 26.6–33.0)
MCHC: 33 g/dL (ref 31.5–35.7)
MCV: 87 fL (ref 79–97)
Monocytes Absolute: 0.3 10*3/uL (ref 0.1–0.9)
Monocytes: 5 %
Neutrophils Absolute: 3.9 10*3/uL (ref 1.4–7.0)
Neutrophils: 64 %
Platelets: 238 10*3/uL (ref 150–450)
RBC: 5.37 x10E6/uL — ABNORMAL HIGH (ref 3.77–5.28)
RDW: 13.3 % (ref 11.7–15.4)
WBC: 6.1 10*3/uL (ref 3.4–10.8)

## 2022-07-22 LAB — THYROID PANEL WITH TSH
Free Thyroxine Index: 1.7 (ref 1.2–4.9)
T3 Uptake Ratio: 25 % (ref 24–39)
T4, Total: 6.7 ug/dL (ref 4.5–12.0)
TSH: 3.2 u[IU]/mL (ref 0.450–4.500)

## 2022-07-22 LAB — BRAIN NATRIURETIC PEPTIDE: BNP: 11.5 pg/mL (ref 0.0–100.0)

## 2022-07-22 LAB — LIPID PANEL
Chol/HDL Ratio: 4.1 ratio (ref 0.0–4.4)
Cholesterol, Total: 197 mg/dL (ref 100–199)
HDL: 48 mg/dL (ref >39)
LDL Chol Calc (NIH): 109 mg/dL — ABNORMAL HIGH (ref 0–99)
Triglycerides: 233 mg/dL — ABNORMAL HIGH (ref 0–149)
VLDL Cholesterol Cal: 40 mg/dL (ref 5–40)

## 2022-08-03 DIAGNOSIS — Z419 Encounter for procedure for purposes other than remedying health state, unspecified: Secondary | ICD-10-CM | POA: Diagnosis not present

## 2022-08-04 ENCOUNTER — Ambulatory Visit: Payer: Medicaid Other | Admitting: Family Medicine

## 2022-08-04 ENCOUNTER — Encounter: Payer: Self-pay | Admitting: Family Medicine

## 2022-08-19 DIAGNOSIS — B349 Viral infection, unspecified: Secondary | ICD-10-CM | POA: Diagnosis not present

## 2022-09-02 DIAGNOSIS — Z419 Encounter for procedure for purposes other than remedying health state, unspecified: Secondary | ICD-10-CM | POA: Diagnosis not present

## 2022-09-12 DIAGNOSIS — S29012A Strain of muscle and tendon of back wall of thorax, initial encounter: Secondary | ICD-10-CM | POA: Diagnosis not present

## 2022-09-16 ENCOUNTER — Encounter: Payer: Self-pay | Admitting: Family Medicine

## 2022-09-16 ENCOUNTER — Ambulatory Visit (INDEPENDENT_AMBULATORY_CARE_PROVIDER_SITE_OTHER): Payer: Medicaid Other | Admitting: Family Medicine

## 2022-09-16 VITALS — BP 122/84 | HR 55 | Temp 97.7°F | Ht 65.0 in | Wt 260.2 lb

## 2022-09-16 DIAGNOSIS — M25472 Effusion, left ankle: Secondary | ICD-10-CM

## 2022-09-16 DIAGNOSIS — G8929 Other chronic pain: Secondary | ICD-10-CM

## 2022-09-16 DIAGNOSIS — I10 Essential (primary) hypertension: Secondary | ICD-10-CM

## 2022-09-16 DIAGNOSIS — M25571 Pain in right ankle and joints of right foot: Secondary | ICD-10-CM

## 2022-09-16 DIAGNOSIS — M25572 Pain in left ankle and joints of left foot: Secondary | ICD-10-CM

## 2022-09-16 DIAGNOSIS — M25471 Effusion, right ankle: Secondary | ICD-10-CM | POA: Diagnosis not present

## 2022-09-16 MED ORDER — MELOXICAM 7.5 MG PO TABS: 7.5000 mg | ORAL_TABLET | Freq: Every day | ORAL | 2 refills | Status: DC

## 2022-09-16 MED ORDER — CHLORTHALIDONE 25 MG PO TABS: 25.0000 mg | ORAL_TABLET | Freq: Every day | ORAL | 3 refills | Status: DC

## 2022-09-16 NOTE — Progress Notes (Signed)
Subjective:  Patient ID: Wanda Murphy, female    DOB: Mar 26, 1971, 51 y.o.   MRN: [id number]  Patient Care Team: Baruch Gouty, FNP as PCP - General (Family Medicine)   Chief Complaint:  Hypertension and BMP (2 week follow up)   HPI: Wanda Murphy is a 51 y.o. female presenting on 09/16/2022 for Hypertension and BMP (2 week follow up)   Hypertension follow up. She has been taking medications and doing well. She reports she needs a refill on her meloxicam for her ankle pain, she has been on this for a while and it does well.   Hypertension This is a chronic problem. The current episode started more than 1 month ago. The problem is controlled. Associated symptoms include peripheral edema. Pertinent negatives include no anxiety, blurred vision, chest pain, headaches, malaise/fatigue, neck pain, orthopnea, palpitations, PND, shortness of breath or sweats. Risk factors for coronary artery disease include obesity and sedentary lifestyle. Past treatments include diuretics. The current treatment provides significant improvement. Compliance problems include diet and exercise.  There is no history of angina, kidney disease, CAD/MI, CVA, heart failure, left ventricular hypertrophy, PVD or retinopathy.      Relevant past medical, surgical, family, and social history reviewed and updated as indicated.  Allergies and medications reviewed and updated. Data reviewed: Chart in Epic.   Past Medical History:  Diagnosis Date   Arthritis     Past Surgical History:  Procedure Laterality Date   TUBAL LIGATION  2004    Social History   Socioeconomic History   Marital status: Widowed    Spouse name: Not on file   Number of children: Not on file   Years of education: Not on file   Highest education level: Not on file  Occupational History   Occupation: Surveyor, quantity: [employer]  Tobacco Use   Smoking status: Never   Smokeless tobacco: Never  Vaping Use   Vaping Use: Never used   Substance and Sexual Activity   Alcohol use: Never   Drug use: Never   Sexual activity: Not Currently  Other Topics Concern   Not on file  Social History Narrative   Not on file   Social Determinants of Health   Financial Resource Strain: Not on file  Food Insecurity: Not on file  Transportation Needs: Not on file  Physical Activity: Not on file  Stress: Not on file  Social Connections: Not on file  Intimate Partner Violence: Not on file    Outpatient Encounter Medications as of 09/16/2022  Medication Sig   [DISCONTINUED] chlorthalidone (HYGROTON) 25 MG tablet Take 1 tablet (25 mg total) by mouth daily.   [DISCONTINUED] meloxicam (MOBIC) 7.5 MG tablet Take 1 tablet (7.5 mg total) by mouth daily.   chlorthalidone (HYGROTON) 25 MG tablet Take 1 tablet (25 mg total) by mouth daily.   meloxicam (MOBIC) 7.5 MG tablet Take 1 tablet (7.5 mg total) by mouth daily.   No facility-administered encounter medications on file as of 09/16/2022.    No Known Allergies  Review of Systems  Constitutional:  Negative for activity change, appetite change, chills, diaphoresis, fatigue, fever, malaise/fatigue and unexpected weight change.  HENT: Negative.    Eyes: Negative.  Negative for blurred vision, photophobia and visual disturbance.  Respiratory:  Negative for cough, chest tightness and shortness of breath.   Cardiovascular:  Negative for chest pain, palpitations, orthopnea, leg swelling and PND.  Gastrointestinal:  Negative for abdominal pain, blood in stool,  constipation, diarrhea, nausea and vomiting.  Endocrine: Negative.  Negative for polydipsia, polyphagia and polyuria.  Genitourinary:  Negative for decreased urine volume, difficulty urinating, dysuria, frequency and urgency.  Musculoskeletal:  Positive for arthralgias and joint swelling. Negative for myalgias and neck pain.  Skin: Negative.   Allergic/Immunologic: Negative.   Neurological:  Negative for dizziness, tremors, syncope,  weakness and headaches.  Hematological: Negative.   Psychiatric/Behavioral:  Negative for confusion, hallucinations, sleep disturbance and suicidal ideas.   All other systems reviewed and are negative.       Objective:  BP 122/84   Pulse (!) 55   Temp 97.7 F (36.5 C) (Temporal)   Ht _0  (1.651 m)   Wt 260 lb 3.2 oz (118 kg)   SpO2 97%   BMI 43.30 kg/m    Wt Readings from Last 3 Encounters:  09/16/22 260 lb 3.2 oz (118 kg)  07/20/22 268 lb 3.2 oz (121.7 kg)  06/22/22 270 lb (122.5 kg)    Physical Exam Vitals and nursing note reviewed.  Constitutional:      General: She is not in acute distress.    Appearance: Normal appearance. She is well-developed and well-groomed. She is morbidly obese. She is not ill-appearing, toxic-appearing or diaphoretic.  HENT:     Head: Normocephalic and atraumatic.     Jaw: There is normal jaw occlusion.     Right Ear: Hearing normal.     Left Ear: Hearing normal.     Nose: Nose normal.     Mouth/Throat:     Lips: Pink.     Mouth: Mucous membranes are moist.     Pharynx: Oropharynx is clear. Uvula midline.  Eyes:     General: Lids are normal.     Pupils: Pupils are equal, round, and reactive to light.  Neck:     Thyroid: No thyroid mass, thyromegaly or thyroid tenderness.     Vascular: No carotid bruit or JVD.     Trachea: Trachea and phonation normal.  Cardiovascular:     Rate and Rhythm: Normal rate and regular rhythm.     Chest Wall: PMI is not displaced.     Pulses: Normal pulses.     Heart sounds: Normal heart sounds. No murmur heard.    No friction rub. No gallop.  Pulmonary:     Effort: Pulmonary effort is normal. No respiratory distress.     Breath sounds: Normal breath sounds. No wheezing.  Abdominal:     General: There is no abdominal bruit.     Palpations: There is no hepatomegaly or splenomegaly.  Musculoskeletal:        General: Normal range of motion.     Cervical back: Normal range of motion and neck supple.      Right lower leg: 1+ Edema present.     Left lower leg: 1+ Edema present.  Lymphadenopathy:     Cervical: No cervical adenopathy.  Skin:    General: Skin is warm and dry.     Capillary Refill: Capillary refill takes less than 2 seconds.     Coloration: Skin is not cyanotic, jaundiced or pale.     Findings: No rash.  Neurological:     General: No focal deficit present.     Mental Status: She is alert and oriented to person, place, and time.     Sensory: Sensation is intact.     Motor: Motor function is intact.     Coordination: Coordination is intact.     Gait:  Gait is intact.     Deep Tendon Reflexes: Reflexes are normal and symmetric.  Psychiatric:        Attention and Perception: Attention and perception normal.        Mood and Affect: Mood and affect normal.        Speech: Speech normal.        Behavior: Behavior normal. Behavior is cooperative.        Thought Content: Thought content normal.        Cognition and Memory: Cognition and memory normal.        Judgment: Judgment normal.     Results for orders placed or performed in visit on 07/20/22  CBC with Differential/Platelet  Result Value Ref Range   WBC 6.1 3.4 - 10.8 x10E3/uL   RBC 5.37 (H) 3.77 - 5.28 x10E6/uL   Hemoglobin 15.5 11.1 - 15.9 g/dL   Hematocrit 46.9 (H) 34.0 - 46.6 %   MCV 87 79 - 97 fL   MCH 28.9 26.6 - 33.0 pg   MCHC 33.0 31.5 - 35.7 g/dL   RDW 13.3 11.7 - 15.4 %   Platelets 238 150 - 450 x10E3/uL   Neutrophils 64 Not Estab. %   Lymphs 27 Not Estab. %   Monocytes 5 Not Estab. %   Eos 3 Not Estab. %   Basos 1 Not Estab. %   Neutrophils Absolute 3.9 1.4 - 7.0 x10E3/uL   Lymphocytes Absolute 1.7 0.7 - 3.1 x10E3/uL   Monocytes Absolute 0.3 0.1 - 0.9 x10E3/uL   EOS (ABSOLUTE) 0.2 0.0 - 0.4 x10E3/uL   Basophils Absolute 0.0 0.0 - 0.2 x10E3/uL   Immature Granulocytes 0 Not Estab. %   Immature Grans (Abs) 0.0 0.0 - 0.1 x10E3/uL  CMP14+EGFR  Result Value Ref Range   Glucose 137 (H) 70 - 99 mg/dL    BUN 16 6 - 24 mg/dL   Creatinine, Ser 0.83 0.57 - 1.00 mg/dL   eGFR 85 >59 mL/min/1.73   BUN/Creatinine Ratio 19 9 - 23   Sodium 141 134 - 144 mmol/L   Potassium 4.0 3.5 - 5.2 mmol/L   Chloride 102 96 - 106 mmol/L   CO2 22 20 - 29 mmol/L   Calcium 10.2 8.7 - 10.2 mg/dL   Total Protein 7.6 6.0 - 8.5 g/dL   Albumin 4.4 3.8 - 4.9 g/dL   Globulin, Total 3.2 1.5 - 4.5 g/dL   Albumin/Globulin Ratio 1.4 1.2 - 2.2   Bilirubin Total 0.3 0.0 - 1.2 mg/dL   Alkaline Phosphatase 112 44 - 121 IU/L   AST 22 0 - 40 IU/L   ALT 17 0 - 32 IU/L  Lipid panel  Result Value Ref Range   Cholesterol, Total 197 100 - 199 mg/dL   Triglycerides 233 (H) 0 - 149 mg/dL   HDL 48 >39 mg/dL   VLDL Cholesterol Cal 40 5 - 40 mg/dL   LDL Chol Calc (NIH) 109 (H) 0 - 99 mg/dL   Chol/HDL Ratio 4.1 0.0 - 4.4 ratio  Thyroid Panel With TSH  Result Value Ref Range   TSH 3.200 0.450 - 4.500 uIU/mL   T4, Total 6.7 4.5 - 12.0 ug/dL   T3 Uptake Ratio 25 24 - 39 %   Free Thyroxine Index 1.7 1.2 - 4.9  Brain natriuretic peptide  Result Value Ref Range   BNP 11.5 0.0 - 100.0 pg/mL       Pertinent labs & imaging results that were available during my care of the  patient were reviewed by me and considered in my medical decision making.  Assessment & Plan:  Saramarie was seen today for hypertension and bmp.  Diagnoses and all orders for this visit:  Primary hypertension Swelling of both ankles  Doing well with below. BP well controlled. Will repeat BMP. DASH diet and exercise encouraged. Follow up in 3 months, sooner if warranted.  -     BMP8+EGFR -     chlorthalidone (HYGROTON) 25 MG tablet; Take 1 tablet (25 mg total) by mouth daily.  Chronic pain of both ankles Doing well on below, will continue.  -     meloxicam (MOBIC) 7.5 MG tablet; Take 1 tablet (7.5 mg total) by mouth daily.     Continue all other maintenance medications.  Follow up plan: Return in about 3 months (around 12/16/2022), or if symptoms worsen or  fail to improve, for chronic follow up.   Continue healthy lifestyle choices, including diet (rich in fruits, vegetables, and lean proteins, and low in salt and simple carbohydrates) and exercise (at least 30 minutes of moderate physical activity daily).  Educational handout given for DASH diet and HTN  The above assessment and management plan was discussed with the patient. The patient verbalized understanding of and has agreed to the management plan. Patient is aware to call the clinic if they develop any new symptoms or if symptoms persist or worsen. Patient is aware when to return to the clinic for a follow-up visit. Patient educated on when it is appropriate to go to the emergency department.    Monia Pouch, FNP-C [facility] [phone]

## 2022-09-16 NOTE — Patient Instructions (Signed)

## 2022-09-17 LAB — BMP8+EGFR
BUN/Creatinine Ratio: 23 (ref 9–23)
BUN: 27 mg/dL — ABNORMAL HIGH (ref 6–24)
CO2: 28 mmol/L (ref 20–29)
Calcium: 10.9 mg/dL — ABNORMAL HIGH (ref 8.7–10.2)
Chloride: 96 mmol/L (ref 96–106)
Creatinine, Ser: 1.17 mg/dL — ABNORMAL HIGH (ref 0.57–1.00)
Glucose: 125 mg/dL — ABNORMAL HIGH (ref 70–99)
Potassium: 3 mmol/L — ABNORMAL LOW (ref 3.5–5.2)
Sodium: 140 mmol/L (ref 134–144)
eGFR: 56 mL/min/{1.73_m2} — ABNORMAL LOW (ref >59)

## 2022-09-19 ENCOUNTER — Telehealth: Payer: Self-pay | Admitting: Family Medicine

## 2022-09-19 MED ORDER — LOSARTAN POTASSIUM 50 MG PO TABS: 50.0000 mg | ORAL_TABLET | Freq: Every day | ORAL | 1 refills | Status: DC

## 2022-09-19 NOTE — Addendum Note (Signed)
Addended by: Sonny Masters on: 09/19/2022 09:59 AM   Modules accepted: Orders

## 2022-09-20 NOTE — Telephone Encounter (Signed)
[email or web address]

## 2022-09-20 NOTE — Telephone Encounter (Signed)
Pt says theres no way she can do it this week. Can come in next Tuesday and do it.

## 2022-09-27 ENCOUNTER — Other Ambulatory Visit: Payer: Medicaid Other

## 2022-09-27 ENCOUNTER — Other Ambulatory Visit: Payer: Self-pay

## 2022-09-27 DIAGNOSIS — I10 Essential (primary) hypertension: Secondary | ICD-10-CM

## 2022-09-27 LAB — BMP8+EGFR
BUN/Creatinine Ratio: 19 (ref 9–23)
BUN: 15 mg/dL (ref 6–24)
CO2: 24 mmol/L (ref 20–29)
Calcium: 9.9 mg/dL (ref 8.7–10.2)
Chloride: 102 mmol/L (ref 96–106)
Creatinine, Ser: 0.77 mg/dL (ref 0.57–1.00)
Glucose: 104 mg/dL — ABNORMAL HIGH (ref 70–99)
Potassium: 3.7 mmol/L (ref 3.5–5.2)
Sodium: 141 mmol/L (ref 134–144)
eGFR: 93 mL/min/{1.73_m2} (ref >59)

## 2022-09-28 DIAGNOSIS — Z20822 Contact with and (suspected) exposure to covid-19: Secondary | ICD-10-CM | POA: Diagnosis not present

## 2022-09-28 DIAGNOSIS — R051 Acute cough: Secondary | ICD-10-CM | POA: Diagnosis not present

## 2022-09-28 DIAGNOSIS — B9789 Other viral agents as the cause of diseases classified elsewhere: Secondary | ICD-10-CM | POA: Diagnosis not present

## 2022-09-28 DIAGNOSIS — R059 Cough, unspecified: Secondary | ICD-10-CM | POA: Diagnosis not present

## 2022-09-28 DIAGNOSIS — M1990 Unspecified osteoarthritis, unspecified site: Secondary | ICD-10-CM | POA: Diagnosis not present

## 2022-09-28 DIAGNOSIS — Z1152 Encounter for screening for COVID-19: Secondary | ICD-10-CM | POA: Diagnosis not present

## 2022-09-28 DIAGNOSIS — J069 Acute upper respiratory infection, unspecified: Secondary | ICD-10-CM | POA: Diagnosis not present

## 2022-10-03 DIAGNOSIS — Z419 Encounter for procedure for purposes other than remedying health state, unspecified: Secondary | ICD-10-CM | POA: Diagnosis not present

## 2022-10-25 ENCOUNTER — Ambulatory Visit: Payer: Medicaid Other | Admitting: Nurse Practitioner

## 2022-10-25 ENCOUNTER — Encounter: Payer: Self-pay | Admitting: Nurse Practitioner

## 2022-10-25 VITALS — BP 182/104 | HR 89 | Temp 97.1°F | Resp 20 | Ht 65.0 in | Wt 272.0 lb

## 2022-10-25 DIAGNOSIS — Z021 Encounter for pre-employment examination: Secondary | ICD-10-CM

## 2022-10-25 DIAGNOSIS — Z0289 Encounter for other administrative examinations: Secondary | ICD-10-CM

## 2022-10-25 DIAGNOSIS — Z9289 Personal history of other medical treatment: Secondary | ICD-10-CM | POA: Diagnosis not present

## 2022-10-25 NOTE — Progress Notes (Signed)
   Subjective:    Patient ID: Wanda Murphy, female    DOB: March 15, 1971, 52 y.o.   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  Eyes:  Negative for pain.  Respiratory:  Negative for shortness of breath.   Cardiovascular:  Negative for chest pain, palpitations and leg swelling.  Gastrointestinal:  Negative for abdominal pain.  Endocrine: Negative for polydipsia.  Skin:  Negative for rash.  Neurological:  Negative for dizziness, weakness and headaches.  Hematological:  Does not bruise/bleed easily.  All other systems reviewed and are negative.      Objective:   Physical Exam Vitals and nursing note reviewed.  Constitutional:      General: She is not in acute distress.    Appearance: Normal appearance. She is well-developed.  Neck:     Vascular: No carotid bruit or JVD.  Cardiovascular:     Rate and Rhythm: Normal rate and regular rhythm.     Heart sounds: Normal heart sounds.  Pulmonary:     Effort: Pulmonary effort is normal. No respiratory distress.     Breath sounds: Normal breath sounds. No wheezing or rales.  Chest:     Chest wall: No tenderness.  Abdominal:     General: Bowel sounds are normal. There is no distension or abdominal bruit.     Palpations: Abdomen is soft. There is no hepatomegaly, splenomegaly, mass or pulsatile mass.     Tenderness: There is no abdominal tenderness.  Musculoskeletal:        General: Normal range of motion.     Cervical back: Normal range of motion and neck supple.  Lymphadenopathy:     Cervical: No cervical adenopathy.  Skin:    General: Skin is warm and dry.  Neurological:     Mental Status: She is alert and oriented to person, place, and time.     Deep Tendon Reflexes: Reflexes are normal and symmetric.  Psychiatric:        Behavior: Behavior normal.        Thought Content: Thought content normal.        Judgment: Judgment normal.    BP (!)  182/104   Pulse 89   Temp (!) 97.1 F (36.2 C) (Temporal)   Resp 20   Ht 5\' 5"  (1.651 m)   Wt 272 lb (123.4 kg)   SpO2 98%   BMI 45.26 kg/m         Assessment & Plan:  Wanda Murphy in today with chief complaint of Employment Physical   1. Encounter for physical examination related to employment  2. 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 The patient verbalized understanding of and has agreed to the management plan. Patient is aware to call the clinic if symptoms persist or worsen. Patient is aware when to return to the clinic for a follow-up visit. Patient educated on when it is appropriate to go to the emergency department.   Mary-Margaret Hassell Done, FNP

## 2022-10-31 LAB — QUANTIFERON-TB GOLD PLUS
QuantiFERON Mitogen Value: >10 IU/mL
QuantiFERON Nil Value: 0.21 IU/mL
QuantiFERON TB1 Ag Value: 0.16 IU/mL
QuantiFERON TB2 Ag Value: 0.17 IU/mL
QuantiFERON-TB Gold Plus: NEGATIVE

## 2022-11-03 DIAGNOSIS — Z419 Encounter for procedure for purposes other than remedying health state, unspecified: Secondary | ICD-10-CM | POA: Diagnosis not present

## 2022-12-02 DIAGNOSIS — Z419 Encounter for procedure for purposes other than remedying health state, unspecified: Secondary | ICD-10-CM | POA: Diagnosis not present

## 2022-12-16 ENCOUNTER — Ambulatory Visit: Payer: Medicaid Other | Admitting: Family Medicine

## 2022-12-17 DIAGNOSIS — J209 Acute bronchitis, unspecified: Secondary | ICD-10-CM | POA: Diagnosis not present

## 2022-12-17 DIAGNOSIS — Z6841 Body Mass Index (BMI) 40.0 and over, adult: Secondary | ICD-10-CM | POA: Diagnosis not present

## 2022-12-17 DIAGNOSIS — R030 Elevated blood-pressure reading, without diagnosis of hypertension: Secondary | ICD-10-CM | POA: Diagnosis not present

## 2022-12-20 DIAGNOSIS — H1033 Unspecified acute conjunctivitis, bilateral: Secondary | ICD-10-CM | POA: Diagnosis not present

## 2023-01-02 ENCOUNTER — Telehealth: Payer: Self-pay | Admitting: Family Medicine

## 2023-01-02 ENCOUNTER — Other Ambulatory Visit: Payer: Self-pay | Admitting: Family Medicine

## 2023-01-02 DIAGNOSIS — I10 Essential (primary) hypertension: Secondary | ICD-10-CM

## 2023-01-02 DIAGNOSIS — Z419 Encounter for procedure for purposes other than remedying health state, unspecified: Secondary | ICD-10-CM | POA: Diagnosis not present

## 2023-01-02 DIAGNOSIS — G8929 Other chronic pain: Secondary | ICD-10-CM

## 2023-01-02 MED ORDER — LOSARTAN POTASSIUM 50 MG PO TABS: 50.0000 mg | ORAL_TABLET | Freq: Every day | ORAL | 0 refills | Status: DC

## 2023-01-02 NOTE — Telephone Encounter (Signed)
  Prescription Request  01/02/2023  Is this a "Controlled Substance" medicine? no  Have you seen your PCP in the last 2 weeks?no pt has appt on 01/12/2023 with michelle and she is about to run out needs enough meds until she is seen  If YES, route message to pool  -  If NO, patient needs to be scheduled for appointment.  What is the name of the medication or equipment?  Losartan and medication for swelling in ankles  Have you contacted your pharmacy to request a refill? yes   Which pharmacy would you like this sent to? [facility]   Patient notified that their request is being sent to the clinical staff for review and that they should receive a response within 2 business days.

## 2023-01-02 NOTE — Telephone Encounter (Signed)
One month supply of Losartan sent to [facility] in [place].

## 2023-01-12 ENCOUNTER — Ambulatory Visit: Payer: Medicaid Other | Admitting: Family Medicine

## 2023-02-01 DIAGNOSIS — Z419 Encounter for procedure for purposes other than remedying health state, unspecified: Secondary | ICD-10-CM | POA: Diagnosis not present

## 2023-03-04 DIAGNOSIS — Z419 Encounter for procedure for purposes other than remedying health state, unspecified: Secondary | ICD-10-CM | POA: Diagnosis not present

## 2023-04-03 DIAGNOSIS — Z419 Encounter for procedure for purposes other than remedying health state, unspecified: Secondary | ICD-10-CM | POA: Diagnosis not present

## 2023-05-03 ENCOUNTER — Other Ambulatory Visit: Payer: Self-pay | Admitting: Family Medicine

## 2023-05-03 DIAGNOSIS — I10 Essential (primary) hypertension: Secondary | ICD-10-CM

## 2023-05-04 DIAGNOSIS — Z419 Encounter for procedure for purposes other than remedying health state, unspecified: Secondary | ICD-10-CM | POA: Diagnosis not present

## 2023-05-06 ENCOUNTER — Other Ambulatory Visit: Payer: Self-pay | Admitting: Family Medicine

## 2023-05-06 DIAGNOSIS — I10 Essential (primary) hypertension: Secondary | ICD-10-CM

## 2023-05-08 NOTE — Telephone Encounter (Signed)
Called pt to schedule appt, pt stated she will call back later to make appt can not be on phone while at work

## 2023-05-08 NOTE — Telephone Encounter (Signed)
Rakes NTBS 30 days given 05/03/23

## 2023-05-22 DIAGNOSIS — Z6841 Body Mass Index (BMI) 40.0 and over, adult: Secondary | ICD-10-CM | POA: Diagnosis not present

## 2023-05-22 DIAGNOSIS — R030 Elevated blood-pressure reading, without diagnosis of hypertension: Secondary | ICD-10-CM | POA: Diagnosis not present

## 2023-05-22 DIAGNOSIS — J069 Acute upper respiratory infection, unspecified: Secondary | ICD-10-CM | POA: Diagnosis not present

## 2023-06-02 ENCOUNTER — Encounter: Payer: Self-pay | Admitting: Family Medicine

## 2023-06-02 ENCOUNTER — Ambulatory Visit (INDEPENDENT_AMBULATORY_CARE_PROVIDER_SITE_OTHER): Payer: Medicaid Other | Admitting: Family Medicine

## 2023-06-02 VITALS — BP 123/76 | HR 100 | Temp 97.7°F | Ht 65.0 in | Wt 264.0 lb

## 2023-06-02 DIAGNOSIS — R202 Paresthesia of skin: Secondary | ICD-10-CM

## 2023-06-02 DIAGNOSIS — J301 Allergic rhinitis due to pollen: Secondary | ICD-10-CM | POA: Diagnosis not present

## 2023-06-02 DIAGNOSIS — Z6841 Body Mass Index (BMI) 40.0 and over, adult: Secondary | ICD-10-CM

## 2023-06-02 DIAGNOSIS — E876 Hypokalemia: Secondary | ICD-10-CM | POA: Diagnosis not present

## 2023-06-02 DIAGNOSIS — I10 Essential (primary) hypertension: Secondary | ICD-10-CM

## 2023-06-02 MED ORDER — LOSARTAN POTASSIUM 50 MG PO TABS
50.0000 mg | ORAL_TABLET | Freq: Every day | ORAL | 1 refills | Status: DC
Start: 2023-06-02 — End: 2023-12-11

## 2023-06-02 MED ORDER — LEVOCETIRIZINE DIHYDROCHLORIDE 5 MG PO TABS
5.0000 mg | ORAL_TABLET | Freq: Every evening | ORAL | 1 refills | Status: DC
Start: 2023-06-02 — End: 2023-12-11

## 2023-06-02 NOTE — Patient Instructions (Addendum)
Chlorthalidone in the morning Losartan in the evening     Goal BP:  For patients younger than 60: Goal BP < 140/90. For patients 60 and older: Goal BP < 150/90. For patients with diabetes: Goal BP < 140/90.  Take your medications faithfully as prescribed. Maintain a healthy weight. Get at least 150 minutes of aerobic exercise per week. Minimize salt intake, less than 2000 mg per day. Minimize alcohol intake.  DASH Eating Plan DASH stands for "Dietary Approaches to Stop Hypertension." The DASH eating plan is a healthy eating plan that has been shown to reduce high blood pressure (hypertension). Additional health benefits may include reducing the risk of type 2 diabetes mellitus, heart disease, and stroke. The DASH eating plan may also help with weight loss.  WHAT DO I NEED TO KNOW ABOUT THE DASH EATING PLAN? For the DASH eating plan, you will follow these general guidelines: Choose foods with a percent daily value for sodium of less than 5% (as listed on the food label). Use salt-free seasonings or herbs instead of table salt or sea salt. Check with your health care provider or pharmacist before using salt substitutes. Eat lower-sodium products, often labeled as "lower sodium" or "no salt added." Eat fresh foods. Eat more vegetables, fruits, and low-fat dairy products. Choose whole grains. Look for the word "whole" as the first word in the ingredient list. Choose fish and skinless chicken or [place] more often than red meat. Limit fish, poultry, and meat to 6 oz (170 g) each day. Limit sweets, desserts, sugars, and sugary drinks. Choose heart-healthy fats. Limit cheese to 1 oz (28 g) per day. Eat more home-cooked food and less restaurant, buffet, and fast food. Limit fried foods. Cook foods using methods other than frying. Limit canned vegetables. If you do use them, rinse them well to decrease the sodium. When eating at a restaurant, ask that your food be prepared with less  salt, or no salt if possible.  WHAT FOODS CAN I EAT? Seek help from a dietitian for individual calorie needs.  Grains Whole grain or whole wheat bread. Brown rice. Whole grain or whole wheat pasta. Quinoa, bulgur, and whole grain cereals. Low-sodium cereals. Corn or whole wheat flour tortillas. Whole grain cornbread. Whole grain crackers. Low-sodium crackers.  Vegetables Fresh or frozen vegetables (raw, steamed, roasted, or grilled). Low-sodium or reduced-sodium tomato and vegetable juices. Low-sodium or reduced-sodium tomato sauce and paste. Low-sodium or reduced-sodium canned vegetables.   Fruits All fresh, canned (in natural juice), or frozen fruits.  Meat and Other Protein Products Ground beef (85% or leaner), grass-fed beef, or beef trimmed of fat. Skinless chicken or [place]. Ground chicken or [place]. Pork trimmed of fat. All fish and seafood. Eggs. Dried beans, peas, or lentils. Unsalted nuts and seeds. Unsalted canned beans.  Dairy Low-fat dairy products, such as skim or 1% milk, 2% or reduced-fat cheeses, low-fat ricotta or cottage cheese, or plain low-fat yogurt. Low-sodium or reduced-sodium cheeses.  Fats and Oils Tub margarines without trans fats. Light or reduced-fat mayonnaise and salad dressings (reduced sodium). Avocado. Safflower, olive, or canola oils. Natural peanut or almond butter.  Other Unsalted popcorn and pretzels. The items listed above may not be a complete list of recommended foods or beverages. Contact your dietitian for more options.  WHAT FOODS ARE NOT RECOMMENDED?  Grains White bread. White pasta. White rice. Refined cornbread. Bagels and croissants. Crackers that contain trans fat.  Vegetables Creamed or fried vegetables. Vegetables in a cheese sauce. Regular canned vegetables.  Regular canned tomato sauce and paste. Regular tomato and vegetable juices.  Fruits Dried fruits. Canned fruit in light or heavy syrup. Fruit juice.  Meat and Other Protein  Products Fatty cuts of meat. Ribs, chicken wings, bacon, sausage, bologna, salami, chitterlings, fatback, hot dogs, bratwurst, and packaged luncheon meats. Salted nuts and seeds. Canned beans with salt.  Dairy Whole or 2% milk, cream, half-and-half, and cream cheese. Whole-fat or sweetened yogurt. Full-fat cheeses or blue cheese. Nondairy creamers and whipped toppings. Processed cheese, cheese spreads, or cheese curds.  Condiments Onion and garlic salt, seasoned salt, table salt, and sea salt. Canned and packaged gravies. Worcestershire sauce. Tartar sauce. Barbecue sauce. Teriyaki sauce. Soy sauce, including reduced sodium. Steak sauce. Fish sauce. Oyster sauce. Cocktail sauce. Horseradish. Ketchup and mustard. Meat flavorings and tenderizers. Bouillon cubes. Hot sauce. Tabasco sauce. Marinades. Taco seasonings. Relishes.  Fats and Oils Butter, stick margarine, lard, shortening, ghee, and bacon fat. Coconut, palm kernel, or palm oils. Regular salad dressings.  Other Pickles and olives. Salted popcorn and pretzels.  The items listed above may not be a complete list of foods and beverages to avoid. Contact your dietitian for more information.  WHERE CAN I FIND MORE INFORMATION? National Heart, Lung, and Blood Institute: [employer] Document Released: 09/08/2011 Document Revised: 02/03/2014 Document Reviewed: 07/24/2013 [facility] Patient Information 2015 [place], [place]. This information is not intended to replace advice given to you by your health care provider. Make sure you discuss any questions you have with your health care provider.   I think that you would greatly benefit from seeing a nutritionist.  If you are interested, please call Dr. Gerilyn Pilgrim at [phone] to schedule an appointment.

## 2023-06-02 NOTE — Progress Notes (Signed)
Subjective:  Patient ID: Wanda Murphy, female    DOB: 08-Apr-1971, 52 y.o.   MRN: [id number]  Patient Care Team: Sonny Masters, FNP as PCP - General (Family Medicine)   Chief Complaint:  Medical Management of Chronic Issues and Numbness (Patient states that she will have burning and numbness in her right foot x 2 months /)   HPI: Wanda Murphy is a 52 y.o. female presenting on 06/02/2023 for Medical Management of Chronic Issues and Numbness (Patient states that she will have burning and numbness in her right foot x 2 months /)   1. Primary hypertension Complaint with meds - Yes Current Medications - losartan, chlorthalidone  Checking BP at home - no Exercising Regularly - No Watching Salt intake - No Pertinent ROS:  Headache - No Fatigue - No Visual Disturbances - No Chest pain - No Dyspnea - No Palpitations - No LE edema - No They report good compliance with medications and can restate their regimen by memory. No medication side effects.  BP Readings from Last 3 Encounters:  06/02/23 123/76  10/25/22 (!) 182/104  09/16/22 122/84     2. BMI 40.0-44.9, adult [facility]) She does not follow a diet or exercise routine.   3. Paresthesias States she has had some numbness and tingling to her feet, more right than left, over the last several months. States this comes and goes, only lasts for a few seconds when is does occur. No injuries, skin discoloration, loss of function, or wounds.   4. Cough  Pt states she has had a dry cough for three weeks. Does have rhinorrhea and nasal congestion at times. Has not tried anything for the symptoms.      Relevant past medical, surgical, family, and social history reviewed and updated as indicated.  Allergies and medications reviewed and updated. Data reviewed: Chart in Epic.   Past Medical History:  Diagnosis Date   Arthritis     Past Surgical History:  Procedure Laterality Date   TUBAL LIGATION  2004    Social History    Socioeconomic History   Marital status: Widowed    Spouse name: Not on file   Number of children: Not on file   Years of education: Not on file   Highest education level: Some college, no degree  Occupational History   Occupation: Lobbyist: [employer]  Tobacco Use   Smoking status: Never   Smokeless tobacco: Never  Vaping Use   Vaping status: Never Used  Substance and Sexual Activity   Alcohol use: Never   Drug use: Never   Sexual activity: Not Currently  Other Topics Concern   Not on file  Social History Narrative   Not on file   Social Determinants of Health   Financial Resource Strain: Medium Risk (05/29/2023)   Overall Financial Resource Strain (CARDIA)    Difficulty of Paying Living Expenses: Somewhat hard  Food Insecurity: Food Insecurity Present (05/29/2023)   Hunger Vital Sign    Worried About Running Out of Food in the Last Year: Sometimes true    Ran Out of Food in the Last Year: Sometimes true  Transportation Needs: No Transportation Needs (05/29/2023)   PRAPARE - Administrator, Civil Service (Medical): No    Lack of Transportation (Non-Medical): No  Physical Activity: Insufficiently Active (05/29/2023)   Exercise Vital Sign    Days of Exercise per Week: 2 days    Minutes of Exercise per  Session: 10 min  Stress: Stress Concern Present (05/29/2023)   [employer] of Occupational Health - Occupational Stress Questionnaire    Feeling of Stress : To some extent  Social Connections: Socially Isolated (05/29/2023)   Social Connection and Isolation Panel [NHANES]    Frequency of Communication with Friends and Family: More than three times a week    Frequency of Social Gatherings with Friends and Family: Once a week    Attends Religious Services: Never    Database administrator or Organizations: No    Attends Engineer, structural: Not on file    Marital Status: Widowed  Intimate Partner Violence: Not on file    Outpatient  Encounter Medications as of 06/02/2023  Medication Sig   chlorthalidone (HYGROTON) 25 MG tablet Take 25 mg by mouth daily.   levocetirizine (XYZAL) 5 MG tablet Take 1 tablet (5 mg total) by mouth every evening.   [DISCONTINUED] losartan (COZAAR) 50 MG tablet Take 1 tablet (50 mg total) by mouth daily. **NEEDS TO BE SEEN BEFORE NEXT REFILL**   losartan (COZAAR) 50 MG tablet Take 1 tablet (50 mg total) by mouth daily.   No facility-administered encounter medications on file as of 06/02/2023.    No Known Allergies  Review of Systems  Constitutional:  Negative for activity change, appetite change, chills, diaphoresis, fatigue, fever and unexpected weight change.  HENT:  Positive for congestion, postnasal drip and rhinorrhea.   Eyes: Negative.  Negative for photophobia and visual disturbance.  Respiratory:  Positive for cough. Negative for apnea, choking, chest tightness, shortness of breath, wheezing and stridor.   Cardiovascular:  Negative for chest pain, palpitations and leg swelling.  Gastrointestinal:  Negative for abdominal pain, blood in stool, constipation, diarrhea, nausea and vomiting.  Endocrine: Negative.  Negative for polydipsia, polyphagia and polyuria.  Genitourinary:  Negative for decreased urine volume, difficulty urinating, dysuria, frequency and urgency.  Musculoskeletal:  Negative for arthralgias and myalgias.  Skin: Negative.   Allergic/Immunologic: Negative.   Neurological:  Positive for numbness (tingling to feet at times). Negative for dizziness, tremors, seizures, syncope, facial asymmetry, speech difficulty, weakness, light-headedness and headaches.  Hematological: Negative.   Psychiatric/Behavioral:  Negative for confusion, hallucinations, sleep disturbance and suicidal ideas.   All other systems reviewed and are negative.       Objective:  BP 123/76   Pulse 100   Temp 97.7 F (36.5 C) (Temporal)   Ht 5\' 5"  (1.651 m)   Wt 264 lb (119.7 kg)   SpO2 94%   BMI  43.93 kg/m    Wt Readings from Last 3 Encounters:  06/02/23 264 lb (119.7 kg)  10/25/22 272 lb (123.4 kg)  09/16/22 260 lb 3.2 oz (118 kg)    Physical Exam Vitals and nursing note reviewed.  Constitutional:      General: She is not in acute distress.    Appearance: Normal appearance. She is well-developed and well-groomed. She is morbidly obese. She is not ill-appearing, toxic-appearing or diaphoretic.  HENT:     Head: Normocephalic and atraumatic.     Jaw: There is normal jaw occlusion.     Right Ear: Hearing, ear canal and external ear normal. A middle ear effusion is present. There is no impacted cerumen. Tympanic membrane is not erythematous.     Left Ear: Hearing, ear canal and external ear normal. A middle ear effusion is present. There is no impacted cerumen. Tympanic membrane is not erythematous.     Nose: Congestion present.  Mouth/Throat:     Lips: Pink.     Mouth: Mucous membranes are moist.     Pharynx: Oropharynx is clear. Uvula midline. No postnasal drip.  Eyes:     General: Lids are normal.     Extraocular Movements: Extraocular movements intact.     Conjunctiva/sclera: Conjunctivae normal.     Pupils: Pupils are equal, round, and reactive to light.  Neck:     Thyroid: No thyroid mass, thyromegaly or thyroid tenderness.     Vascular: No JVD.     Trachea: Trachea and phonation normal.  Cardiovascular:     Rate and Rhythm: Normal rate and regular rhythm.     Chest Wall: PMI is not displaced.     Pulses: Normal pulses.     Heart sounds: Normal heart sounds. No murmur heard.    No friction rub. No gallop.  Pulmonary:     Effort: Pulmonary effort is normal. No respiratory distress.     Breath sounds: Normal breath sounds. No wheezing.  Abdominal:     General: Bowel sounds are normal. There is no abdominal bruit.     Palpations: Abdomen is soft. There is no hepatomegaly or splenomegaly.  Musculoskeletal:        General: No swelling, tenderness, deformity or  signs of injury. Normal range of motion.     Cervical back: Normal range of motion and neck supple.     Right lower leg: No edema.     Left lower leg: No edema.  Skin:    General: Skin is warm and dry.     Capillary Refill: Capillary refill takes less than 2 seconds.     Coloration: Skin is not cyanotic, jaundiced or pale.     Findings: No rash.  Neurological:     General: No focal deficit present.     Mental Status: She is alert and oriented to person, place, and time.     Sensory: Sensation is intact.     Motor: Motor function is intact.     Coordination: Coordination is intact.     Gait: Gait is intact.     Deep Tendon Reflexes: Reflexes are normal and symmetric.  Psychiatric:        Attention and Perception: Attention and perception normal.        Mood and Affect: Mood and affect normal.        Speech: Speech normal.        Behavior: Behavior normal. Behavior is cooperative.        Thought Content: Thought content normal.        Cognition and Memory: Cognition and memory normal.        Judgment: Judgment normal.     Results for orders placed or performed in visit on 10/25/22  QuantiFERON-TB Gold Plus  Result Value Ref Range   QuantiFERON Incubation Incubation performed.    QuantiFERON Criteria Comment    QuantiFERON TB1 Ag Value 0.16 IU/mL   QuantiFERON TB2 Ag Value 0.17 IU/mL   QuantiFERON Nil Value 0.21 IU/mL   QuantiFERON Mitogen Value >10.00 IU/mL   QuantiFERON-TB Gold Plus Negative Negative       Pertinent labs & imaging results that were available during my care of the patient were reviewed by me and considered in my medical decision making.  Assessment & Plan:  Ahaana was seen today for medical management of chronic issues and numbness.  Diagnoses and all orders for this visit:  Primary hypertension BP well controlled. Changes were not  made in regimen today. Goal BP is 130/80. Pt aware to report any persistent high or low readings. DASH diet and exercise  encouraged. Exercise at least 150 minutes per week and increase as tolerated. Goal BMI > 25. Stress management encouraged. Avoid nicotine and tobacco product use. Avoid excessive alcohol and NSAID's. Avoid more than 2000 mg of sodium daily. Medications as prescribed. Follow up as scheduled.  -     losartan (COZAAR) 50 MG tablet; Take 1 tablet (50 mg total) by mouth daily. -     Anemia Profile B -     CMP14+EGFR -     Lipid panel -     Thyroid Panel With TSH  BMI 40.0-44.9, adult (HCC) Diet and exercise encouraged. Labs pending.  -     Anemia Profile B -     CMP14+EGFR -     Lipid panel -     Thyroid Panel With TSH  Paresthesias Will check for potential underlying causes such as anemia or electrolyte deficiencies, pt aware to report new, worsening, or persistent symptoms.  -     Anemia Profile B -     CMP14+EGFR  Seasonal allergic rhinitis due to pollen Will initiate below, report continued symptoms.  -     levocetirizine (XYZAL) 5 MG tablet; Take 1 tablet (5 mg total) by mouth every evening.     Continue all other maintenance medications.  Follow up plan: Return in about 5 months (around 11/02/2023) for CPE.   Continue healthy lifestyle choices, including diet (rich in fruits, vegetables, and lean proteins, and low in salt and simple carbohydrates) and exercise (at least 30 minutes of moderate physical activity daily).  Educational handout given for DASH, HTN  The above assessment and management plan was discussed with the patient. The patient verbalized understanding of and has agreed to the management plan. Patient is aware to call the clinic if they develop any new symptoms or if symptoms persist or worsen. Patient is aware when to return to the clinic for a follow-up visit. Patient educated on when it is appropriate to go to the emergency department.   Kari Baars, FNP-C [facility] [phone]

## 2023-06-03 LAB — THYROID PANEL WITH TSH
Free Thyroxine Index: 2 (ref 1.2–4.9)
T3 Uptake Ratio: 25 % (ref 24–39)
T4, Total: 7.8 ug/dL (ref 4.5–12.0)
TSH: 3.73 u[IU]/mL (ref 0.450–4.500)

## 2023-06-03 LAB — CMP14+EGFR
ALT: 18 IU/L (ref 0–32)
AST: 24 IU/L (ref 0–40)
Albumin: 4.2 g/dL (ref 3.8–4.9)
Alkaline Phosphatase: 90 IU/L (ref 44–121)
BUN/Creatinine Ratio: 19 (ref 9–23)
BUN: 17 mg/dL (ref 6–24)
Bilirubin Total: 0.4 mg/dL (ref 0.0–1.2)
CO2: 25 mmol/L (ref 20–29)
Calcium: 10.8 mg/dL — ABNORMAL HIGH (ref 8.7–10.2)
Chloride: 93 mmol/L — ABNORMAL LOW (ref 96–106)
Creatinine, Ser: 0.9 mg/dL (ref 0.57–1.00)
Globulin, Total: 3.4 g/dL (ref 1.5–4.5)
Glucose: 100 mg/dL — ABNORMAL HIGH (ref 70–99)
Potassium: 3.2 mmol/L — ABNORMAL LOW (ref 3.5–5.2)
Sodium: 136 mmol/L (ref 134–144)
Total Protein: 7.6 g/dL (ref 6.0–8.5)
eGFR: 76 mL/min/{1.73_m2} (ref >59)

## 2023-06-03 LAB — ANEMIA PROFILE B
Basophils Absolute: 0.1 10*3/uL (ref 0.0–0.2)
Basos: 1 %
EOS (ABSOLUTE): 0.1 10*3/uL (ref 0.0–0.4)
Eos: 2 %
Ferritin: 155 ng/mL — ABNORMAL HIGH (ref 15–150)
Folate: 15.9 ng/mL (ref >3.0)
Hematocrit: 43.2 % (ref 34.0–46.6)
Hemoglobin: 14.7 g/dL (ref 11.1–15.9)
Immature Grans (Abs): 0 10*3/uL (ref 0.0–0.1)
Immature Granulocytes: 0 %
Iron Saturation: 23 % (ref 15–55)
Iron: 69 ug/dL (ref 27–159)
Lymphocytes Absolute: 2.2 10*3/uL (ref 0.7–3.1)
Lymphs: 27 %
MCH: 30.2 pg (ref 26.6–33.0)
MCHC: 34 g/dL (ref 31.5–35.7)
MCV: 89 fL (ref 79–97)
Monocytes Absolute: 0.7 10*3/uL (ref 0.1–0.9)
Monocytes: 8 %
Neutrophils Absolute: 5.1 10*3/uL (ref 1.4–7.0)
Neutrophils: 62 %
Platelets: 299 10*3/uL (ref 150–450)
RBC: 4.86 x10E6/uL (ref 3.77–5.28)
RDW: 13 % (ref 11.7–15.4)
Retic Ct Pct: 1.9 % (ref 0.6–2.6)
Total Iron Binding Capacity: 300 ug/dL (ref 250–450)
UIBC: 231 ug/dL (ref 131–425)
Vitamin B-12: 494 pg/mL (ref 232–1245)
WBC: 8.1 10*3/uL (ref 3.4–10.8)

## 2023-06-03 LAB — LIPID PANEL
Chol/HDL Ratio: 3.6 ratio (ref 0.0–4.4)
Cholesterol, Total: 173 mg/dL (ref 100–199)
HDL: 48 mg/dL (ref >39)
LDL Chol Calc (NIH): 100 mg/dL — ABNORMAL HIGH (ref 0–99)
Triglycerides: 145 mg/dL (ref 0–149)
VLDL Cholesterol Cal: 25 mg/dL (ref 5–40)

## 2023-06-04 DIAGNOSIS — Z419 Encounter for procedure for purposes other than remedying health state, unspecified: Secondary | ICD-10-CM | POA: Diagnosis not present

## 2023-06-05 NOTE — Addendum Note (Signed)
Addended by: Sonny Masters on: 06/05/2023 08:27 PM   Modules accepted: Orders

## 2023-08-04 DIAGNOSIS — Z419 Encounter for procedure for purposes other than remedying health state, unspecified: Secondary | ICD-10-CM | POA: Diagnosis not present

## 2023-08-25 ENCOUNTER — Encounter: Payer: Self-pay | Admitting: *Deleted

## 2023-09-03 DIAGNOSIS — Z419 Encounter for procedure for purposes other than remedying health state, unspecified: Secondary | ICD-10-CM | POA: Diagnosis not present

## 2023-09-25 ENCOUNTER — Telehealth: Payer: Self-pay | Admitting: Family Medicine

## 2023-09-25 NOTE — Telephone Encounter (Signed)
Returned call//pt aware [facility] does not have apts before new yr. Pt aware of self pay fees.  Copied from CRM [id number]. Topic: Appointments - Appointment Scheduling >> Sep 25, 2023 12:39 PM Whitney O wrote: Patient/patient representative is calling to schedule an appointment. Refer to attachments for appointment information. Patient was requesting a appointment to be seen for weight loss and to get on weight loss medication before next year / didn't see any appointments before the new year . Patient also requested the fee for a appointment to be seen with no insurance . Give patient a call concerning this information please. [phone]

## 2023-09-28 ENCOUNTER — Other Ambulatory Visit: Payer: Self-pay | Admitting: Family Medicine

## 2023-10-09 ENCOUNTER — Ambulatory Visit: Payer: Medicaid Other

## 2023-10-10 ENCOUNTER — Telehealth: Payer: Self-pay | Admitting: Family Medicine

## 2023-10-10 ENCOUNTER — Ambulatory Visit (INDEPENDENT_AMBULATORY_CARE_PROVIDER_SITE_OTHER): Payer: Self-pay

## 2023-10-10 DIAGNOSIS — Z111 Encounter for screening for respiratory tuberculosis: Secondary | ICD-10-CM

## 2023-10-10 NOTE — Progress Notes (Signed)
 Patient is in office today for a nurse visit for TB skin test. Patient Injection was given in the  Left arm. Patient tolerated injection well.  Appointment scheduled for patient to return on 10/12/23 to have test read.

## 2023-10-10 NOTE — Telephone Encounter (Signed)
 Wanda Murphy dropped off Staff Health Assessment/Medical Report forms to be completed and signed.  Form Fee Paid? (Y/N)   No charge-per [facility]         If NO, form is placed on front office manager desk to hold until payment received. If YES, then form will be placed in the RX/HH Nurse Coordinators box for completion.  Form will not be processed until payment is received   Please call pt, when form is ready.

## 2023-10-11 NOTE — Telephone Encounter (Signed)
 [facility] paperwork ready to pick up

## 2023-10-12 ENCOUNTER — Ambulatory Visit: Payer: Self-pay

## 2023-10-12 DIAGNOSIS — Z111 Encounter for screening for respiratory tuberculosis: Secondary | ICD-10-CM

## 2023-10-12 LAB — TB SKIN TEST
Induration: 0 mm
TB Skin Test: NEGATIVE

## 2023-10-12 NOTE — Progress Notes (Signed)
 Patient here today for TB skin test read, results were normal.

## 2023-11-03 ENCOUNTER — Encounter: Payer: Medicaid Other | Admitting: Family Medicine

## 2023-12-09 ENCOUNTER — Other Ambulatory Visit: Payer: Self-pay | Admitting: Family Medicine

## 2023-12-09 DIAGNOSIS — J301 Allergic rhinitis due to pollen: Secondary | ICD-10-CM

## 2023-12-09 DIAGNOSIS — I10 Essential (primary) hypertension: Secondary | ICD-10-CM

## 2023-12-18 ENCOUNTER — Encounter: Payer: Self-pay | Admitting: Nurse Practitioner

## 2023-12-18 ENCOUNTER — Ambulatory Visit: Admitting: Nurse Practitioner

## 2023-12-18 ENCOUNTER — Telehealth: Payer: Self-pay

## 2023-12-18 VITALS — BP 110/75 | HR 88 | Temp 97.4°F | Ht 65.0 in | Wt 261.2 lb

## 2023-12-18 DIAGNOSIS — M545 Low back pain, unspecified: Secondary | ICD-10-CM | POA: Diagnosis not present

## 2023-12-18 MED ORDER — PREDNISONE 20 MG PO TABS
ORAL_TABLET | ORAL | 0 refills | Status: DC
Start: 2023-12-18 — End: 2024-01-05

## 2023-12-18 MED ORDER — KETOROLAC TROMETHAMINE 60 MG/2ML IM SOLN
60.0000 mg | Freq: Once | INTRAMUSCULAR | Status: AC
  Administered 2023-12-18: 60 mg via INTRAMUSCULAR

## 2023-12-18 MED ORDER — METHYLPREDNISOLONE ACETATE 80 MG/ML IJ SUSP
80.0000 mg | Freq: Once | INTRAMUSCULAR | Status: AC
  Administered 2023-12-18: 80 mg via INTRAMUSCULAR

## 2023-12-18 MED ORDER — PREDNISONE 20 MG PO TABS: ORAL_TABLET | ORAL | 0 refills | Status: DC

## 2023-12-18 NOTE — Progress Notes (Signed)
   Subjective:    Patient ID: Wanda Murphy, female    DOB: 30-Oct-1970, 53 y.o.   MRN: [id number]   Chief Complaint: Back Pain (Back pain started 1 day ago, pain is between shoulder blades. )   Back Pain The current episode started yesterday. The problem occurs constantly. The quality of the pain is described as stabbing and aching. The pain does not radiate. The pain is at a severity of 8/10. The pain is moderate. The pain is The same all the time. The symptoms are aggravated by bending, position, standing and twisting. Pertinent negatives include no bladder incontinence, bowel incontinence, numbness, paresis, paresthesias or tingling. She has tried NSAIDs for the symptoms. The treatment provided mild relief.    Patient Active Problem List   Diagnosis Date Noted   Primary hypertension 07/20/2022   BMI 40.0-44.9, adult (HCC) 07/20/2022   Right foot pain 10/25/2021   Acute pain of right knee 07/06/2021   Morbid obesity (HCC) 10/28/2019   Arthritis        Review of Systems  Gastrointestinal:  Negative for bowel incontinence.  Genitourinary:  Negative for bladder incontinence.  Musculoskeletal:  Positive for back pain.  Neurological:  Negative for tingling, numbness and paresthesias.       Objective:   Physical Exam Constitutional:      Appearance: Normal appearance. She is obese.  Cardiovascular:     Rate and Rhythm: Normal rate and regular rhythm.  Pulmonary:     Effort: Pulmonary effort is normal.     Breath sounds: Normal breath sounds.  Musculoskeletal:     Comments: Limited ROM of lower back with pain on flexion and rotation (-) SLR bil Motor strength and sensation distally intact  Skin:    General: Skin is warm.  Neurological:     General: No focal deficit present.     Mental Status: She is alert and oriented to person, place, and time.  Psychiatric:        Mood and Affect: Mood normal.        Behavior: Behavior normal.    BP 110/75   Pulse 88   Temp (!)  97.4 F (36.3 C)   Ht 5\' 5"  (1.651 m)   Wt 261 lb 3.2 oz (118.5 kg)   SpO2 97%   BMI 43.47 kg/m         Assessment & Plan:   Wanda Murphy in today with chief complaint of Back Pain (Back pain started 1 day ago, pain is between shoulder blades. )   1. Acute midline low back pain without sciatica (Primary) Moist heat  Rest Back stretches RTO prn - methylPREDNISolone acetate (DEPO-MEDROL) injection 80 mg - ketorolac (TORADOL) injection 60 mg    The above assessment and management plan was discussed with the patient. The patient verbalized understanding of and has agreed to the management plan. Patient is aware to call the clinic if symptoms persist or worsen. Patient is aware when to return to the clinic for a follow-up visit. Patient educated on when it is appropriate to go to the emergency department.   Mary-Margaret Daphine Deutscher, FNP

## 2023-12-18 NOTE — Patient Instructions (Signed)
 Acute Back Pain, Adult Acute back pain is sudden and usually short-lived. It is often caused by an injury to the muscles and tissues in the back. The injury may result from: A muscle, tendon, or ligament getting overstretched or torn. Ligaments are tissues that connect bones to each other. Lifting something improperly can cause a back strain. Wear and tear (degeneration) of the spinal disks. Spinal disks are circular tissue that provide cushioning between the bones of the spine (vertebrae). Twisting motions, such as while playing sports or doing yard work. A hit to the back. Arthritis. You may have a physical exam, lab tests, and imaging tests to find the cause of your pain. Acute back pain usually goes away with rest and home care. Follow these instructions at home: Managing pain, stiffness, and swelling Take over-the-counter and prescription medicines only as told by your health care provider. Treatment may include medicines for pain and inflammation that are taken by mouth or applied to the skin, or muscle relaxants. Your health care provider may recommend applying ice during the first 24-48 hours after your pain starts. To do this: Put ice in a plastic bag. Place a towel between your skin and the bag. Leave the ice on for 20 minutes, 2-3 times a day. Remove the ice if your skin turns bright red. This is very important. If you cannot feel pain, heat, or cold, you have a greater risk of damage to the area. If directed, apply heat to the affected area as often as told by your health care provider. Use the heat source that your health care provider recommends, such as a moist heat pack or a heating pad. Place a towel between your skin and the heat source. Leave the heat on for 20-30 minutes. Remove the heat if your skin turns bright red. This is especially important if you are unable to feel pain, heat, or cold. You have a greater risk of getting burned. Activity  Do not stay in bed. Staying in  bed for more than 1-2 days can delay your recovery. Sit up and stand up straight. Avoid leaning forward when you sit or hunching over when you stand. If you work at a desk, sit close to it so you do not need to lean over. Keep your chin tucked in. Keep your neck drawn back, and keep your elbows bent at a 90-degree angle (right angle). Sit high and close to the steering wheel when you drive. Add lower back (lumbar) support to your car seat, if needed. Take short walks on even surfaces as soon as you are able. Try to increase the length of time you walk each day. Do not sit, drive, or stand in one place for more than 30 minutes at a time. Sitting or standing for long periods of time can put stress on your back. Do not drive or use heavy machinery while taking prescription pain medicine. Use proper lifting techniques. When you bend and lift, use positions that put less stress on your back: [place] your knees. Keep the load close to your body. Avoid twisting. Exercise regularly as told by your health care provider. Exercising helps your back heal faster and helps prevent back injuries by keeping muscles strong and flexible. Work with a physical therapist to make a safe exercise program, as recommended by your health care provider. Do any exercises as told by your physical therapist. Lifestyle Maintain a healthy weight. Extra weight puts stress on your back and makes it difficult to have good  posture. Avoid activities or situations that make you feel anxious or stressed. Stress and anxiety increase muscle tension and can make back pain worse. Learn ways to manage anxiety and stress, such as through exercise. General instructions Sleep on a firm mattress in a comfortable position. Try lying on your side with your knees slightly bent. If you lie on your back, put a pillow under your knees. Keep your head and neck in a straight line with your spine (neutral position) when using electronic equipment like  smartphones or pads. To do this: Raise your smartphone or pad to look at it instead of bending your head or neck to look down. Put the smartphone or pad at the level of your face while looking at the screen. Follow your treatment plan as told by your health care provider. This may include: Cognitive or behavioral therapy. Acupuncture or massage therapy. Meditation or yoga. Contact a health care provider if: You have pain that is not relieved with rest or medicine. You have increasing pain going down into your legs or buttocks. Your pain does not improve after 2 weeks. You have pain at night. You lose weight without trying. You have a fever or chills. You develop nausea or vomiting. You develop abdominal pain. Get help right away if: You develop new bowel or bladder control problems. You have unusual weakness or numbness in your arms or legs. You feel faint. These symptoms may represent a serious problem that is an emergency. Do not wait to see if the symptoms will go away. Get medical help right away. Call your local emergency services (911 in the [place] Do not drive yourself to the hospital. Summary Acute back pain is sudden and usually short-lived. Use proper lifting techniques. When you bend and lift, use positions that put less stress on your back. Take over-the-counter and prescription medicines only as told by your health care provider, and apply heat or ice as told. This information is not intended to replace advice given to you by your health care provider. Make sure you discuss any questions you have with your health care provider. Document Revised: 12/11/2020 Document Reviewed: 12/11/2020 Elsevier Patient Education  2024 [employer].

## 2023-12-18 NOTE — Telephone Encounter (Signed)
 Copied from CRM [id number]. Topic: Clinical - Medication Question >> Dec 18, 2023  4:00 PM Shelah Lewandowsky wrote: Reason for CRM: Wants to know if the shots given today will cause drowsiness, please call [phone]

## 2023-12-18 NOTE — Telephone Encounter (Signed)
 Discussed with patient that the Ketorolac can cause drowsiness. Patient is aware and verbalized understanding.

## 2024-01-05 ENCOUNTER — Ambulatory Visit: Admitting: Family Medicine

## 2024-01-05 ENCOUNTER — Encounter: Payer: Self-pay | Admitting: Family Medicine

## 2024-01-05 VITALS — BP 109/75 | HR 91 | Temp 97.2°F | Ht 65.0 in | Wt 258.2 lb

## 2024-01-05 DIAGNOSIS — R7309 Other abnormal glucose: Secondary | ICD-10-CM

## 2024-01-05 DIAGNOSIS — I10 Essential (primary) hypertension: Secondary | ICD-10-CM

## 2024-01-05 DIAGNOSIS — E876 Hypokalemia: Secondary | ICD-10-CM | POA: Diagnosis not present

## 2024-01-05 LAB — BAYER DCA HB A1C WAIVED: HB A1C (BAYER DCA - WAIVED): 6.4 % — ABNORMAL HIGH (ref 4.8–5.6)

## 2024-01-05 NOTE — Progress Notes (Signed)
 Subjective:  Patient ID: JAMESA TEDRICK, female    DOB: [birth year], 53 y.o.   MRN: [id number]  Patient Care Team: Sonny Masters, FNP as PCP - General (Family Medicine)   Chief Complaint:  Medical Management of Chronic Issues   HPI: Ajanee DAYLEN HACK is a 53 y.o. female presenting on 01/05/2024 for Medical Management of Chronic Issues   Discussed the use of AI scribe software for clinical note transcription with the patient, who gave verbal consent to proceed.  History of Present Illness   Letizia ALYSON KI is a 53 year old female with hypertension who presents for a routine follow-up visit.  She has been experiencing swelling in her ankles and feet. She is currently taking chlorthalidone and losartan for hypertension. She has not been using compression socks and has not recently limited her salt intake. No recent leg swelling reported in ROS.  Her mood is variable, with some days being better than others. Certain dates, such as the anniversary of her husband's death, are particularly difficult. She experiences situational anxiety related to financial stress and managing bills.  She has occasional difficulty sleeping, either sleeping well or waking up multiple times a night. She has not tried any sleep aids like melatonin, despite her son's suggestion.  She mentions that her left hand occasionally cramps up at strange times, but these episodes are brief and do not last long.  No recent visual changes, hearing changes, or bowel movement issues. She has not been taking her allergy medication, Xyzal, as she has not experienced any allergy symptoms recently.          Relevant past medical, surgical, family, and social history reviewed and updated as indicated.  Allergies and medications reviewed and updated. Data reviewed: Chart in Epic.   Past Medical History:  Diagnosis Date   Arthritis     Past Surgical History:  Procedure Laterality Date   TUBAL LIGATION  2004    Social  History   Socioeconomic History   Marital status: Widowed    Spouse name: Not on file   Number of children: Not on file   Years of education: Not on file   Highest education level: Some college, no degree  Occupational History   Occupation: Lobbyist: [employer]  Tobacco Use   Smoking status: Never   Smokeless tobacco: Never  Vaping Use   Vaping status: Never Used  Substance and Sexual Activity   Alcohol use: Never   Drug use: Never   Sexual activity: Not Currently  Other Topics Concern   Not on file  Social History Narrative   Not on file   Social Drivers of Health   Financial Resource Strain: Medium Risk (05/29/2023)   Overall Financial Resource Strain (CARDIA)    Difficulty of Paying Living Expenses: Somewhat hard  Food Insecurity: Food Insecurity Present (05/29/2023)   Hunger Vital Sign    Worried About Running Out of Food in the Last Year: Sometimes true    Ran Out of Food in the Last Year: Sometimes true  Transportation Needs: No Transportation Needs (05/29/2023)   PRAPARE - Administrator, Civil Service (Medical): No    Lack of Transportation (Non-Medical): No  Physical Activity: Insufficiently Active (05/29/2023)   Exercise Vital Sign    Days of Exercise per Week: 2 days    Minutes of Exercise per Session: 10 min  Stress: Stress Concern Present (05/29/2023)   [employer] of Occupational Health -  Occupational Stress Questionnaire    Feeling of Stress : To some extent  Social Connections: Socially Isolated (05/29/2023)   Social Connection and Isolation Panel [NHANES]    Frequency of Communication with Friends and Family: More than three times a week    Frequency of Social Gatherings with Friends and Family: Once a week    Attends Religious Services: Never    Database administrator or Organizations: No    Attends Engineer, structural: Not on file    Marital Status: Widowed  Intimate Partner Violence: Not on file    Outpatient  Encounter Medications as of 01/05/2024  Medication Sig   chlorthalidone (HYGROTON) 25 MG tablet Take 1 tablet by mouth once daily   losartan (COZAAR) 50 MG tablet Take 1 tablet (50 mg total) by mouth daily. **NEEDS TO BE SEEN BEFORE NEXT REFILL**   [DISCONTINUED] levocetirizine (XYZAL) 5 MG tablet Take 1 tablet (5 mg total) by mouth every evening. **NEEDS TO BE SEEN BEFORE NEXT REFILL**   [DISCONTINUED] predniSONE (DELTASONE) 20 MG tablet 2 po at sametime daily for 5 days-   No facility-administered encounter medications on file as of 01/05/2024.    No Known Allergies  Pertinent ROS per HPI, otherwise unremarkable      Objective:  BP 109/75   Pulse 91   Temp (!) 97.2 F (36.2 C) (Temporal)   Ht 5\' 5"  (1.651 m)   Wt 258 lb 3.2 oz (117.1 kg)   SpO2 96%   BMI 42.97 kg/m    Wt Readings from Last 3 Encounters:  01/05/24 258 lb 3.2 oz (117.1 kg)  12/18/23 261 lb 3.2 oz (118.5 kg)  06/02/23 264 lb (119.7 kg)    Physical Exam Vitals and nursing note reviewed.  Constitutional:      General: She is not in acute distress.    Appearance: Normal appearance. She is morbidly obese. She is not ill-appearing, toxic-appearing or diaphoretic.  HENT:     Head: Normocephalic and atraumatic.     Right Ear: Tympanic membrane, ear canal and external ear normal.     Left Ear: Tympanic membrane, ear canal and external ear normal.     Nose: Nose normal.     Mouth/Throat:     Mouth: Mucous membranes are moist.     Pharynx: Oropharynx is clear.  Eyes:     Conjunctiva/sclera: Conjunctivae normal.     Pupils: Pupils are equal, round, and reactive to light.  Cardiovascular:     Rate and Rhythm: Normal rate and regular rhythm.     Heart sounds: Normal heart sounds.  Pulmonary:     Effort: Pulmonary effort is normal.     Breath sounds: Normal breath sounds.  Musculoskeletal:     Cervical back: Normal range of motion and neck supple.     Right lower leg: 1+ Edema present.     Left lower leg: 1+  Edema present.  Skin:    General: Skin is warm and dry.     Capillary Refill: Capillary refill takes less than 2 seconds.  Neurological:     General: No focal deficit present.     Mental Status: She is alert and oriented to person, place, and time.  Psychiatric:        Mood and Affect: Mood normal.        Behavior: Behavior normal. Behavior is cooperative.        Thought Content: Thought content normal.        Judgment: Judgment normal.  Results for orders placed or performed in visit on 10/10/23  TB Skin Test   Collection Time: 10/12/23 10:49 AM  Result Value Ref Range   TB Skin Test Negative    Induration 0 mm       Pertinent labs & imaging results that were available during my care of the patient were reviewed by me and considered in my medical decision making.  Assessment & Plan:  Charlene was seen today for medical management of chronic issues.  Diagnoses and all orders for this visit:  Primary hypertension -     CMP14+EGFR -     CBC with Differential/Platelet -     Lipid panel -     Thyroid Panel With TSH  Morbid obesity (HCC) -     CMP14+EGFR -     CBC with Differential/Platelet -     Lipid panel -     Thyroid Panel With TSH -     VITAMIN D 25 Hydroxy (Vit-D Deficiency, Fractures) -     Bayer DCA Hb A1c Waived  Elevated glucose -     Bayer DCA Hb A1c Waived     Assessment and Plan    Hypertension She is on chlorthalidone and losartan for hypertension. Reports peripheral edema, indicating possible fluid retention. Increasing the dose of chlorthalidone is considered to address this issue. Advised to limit sodium intake to help manage the edema. Compression stockings are recommended, though acknowledged as potentially uncomfortable in warm weather. - Consider increasing chlorthalidone dosage - Advise on limiting sodium intake - Recommend wearing compression stockings  Situational Depression Experiences situational depression, with certain days being more  challenging due to personal losses, such as the anniversary of her husband's death. Financial stressors also contribute to her emotional state.  Sleep Disturbance Reports difficulty sleeping, with some nights being restful and others involving frequent awakenings. Has not tried any sleep aids like melatonin due to concerns about daytime drowsiness. Advised to trial melatonin starting at a low dose on weekends to assess its effect. - Recommend trial of melatonin starting at 3 mg on weekends  General Health Maintenance Has not had recent health maintenance screenings such as a mammogram. Started a new job and is adjusting to her schedule. Plans to schedule a physical exam towards the end of the year. - Schedule physical exam towards the end of the year  Laboratory Monitoring Plans to update lab work to monitor renal function, potassium, calcium, and glucose levels, especially considering her current medication regimen. Previous labs showed hypokalemia, hypercalcemia, and hyperglycemia, necessitating re-evaluation. - Order lab tests to check renal function, potassium, calcium, and glucose levels          Continue all other maintenance medications.  Follow up plan: Return in about 8 months (around 09/05/2024), or if symptoms worsen or fail to improve, for Annual Physical.   Continue healthy lifestyle choices, including diet (rich in fruits, vegetables, and lean proteins, and low in salt and simple carbohydrates) and exercise (at least 30 minutes of moderate physical activity daily).  Educational handout given for health maintenance   The above assessment and management plan was discussed with the patient. The patient verbalized understanding of and has agreed to the management plan. Patient is aware to call the clinic if they develop any new symptoms or if symptoms persist or worsen. Patient is aware when to return to the clinic for a follow-up visit. Patient educated on when it is appropriate  to go to the emergency department.   Marcelino Duster  Shakeyla Giebler, FNP-C [facility] [phone]

## 2024-01-06 LAB — CMP14+EGFR
ALT: 26 IU/L (ref 0–32)
AST: 24 IU/L (ref 0–40)
Albumin: 4.1 g/dL (ref 3.8–4.9)
Alkaline Phosphatase: 105 IU/L (ref 44–121)
BUN/Creatinine Ratio: 21 (ref 9–23)
BUN: 18 mg/dL (ref 6–24)
Bilirubin Total: 0.4 mg/dL (ref 0.0–1.2)
CO2: 27 mmol/L (ref 20–29)
Calcium: 10.3 mg/dL — ABNORMAL HIGH (ref 8.7–10.2)
Chloride: 92 mmol/L — ABNORMAL LOW (ref 96–106)
Creatinine, Ser: 0.87 mg/dL (ref 0.57–1.00)
Globulin, Total: 2.9 g/dL (ref 1.5–4.5)
Glucose: 116 mg/dL — ABNORMAL HIGH (ref 70–99)
Potassium: 2.9 mmol/L — ABNORMAL LOW (ref 3.5–5.2)
Sodium: 136 mmol/L (ref 134–144)
Total Protein: 7 g/dL (ref 6.0–8.5)
eGFR: 79 mL/min/{1.73_m2} (ref >59)

## 2024-01-06 LAB — CBC WITH DIFFERENTIAL/PLATELET
Basophils Absolute: 0.1 10*3/uL (ref 0.0–0.2)
Basos: 1 %
EOS (ABSOLUTE): 0.1 10*3/uL (ref 0.0–0.4)
Eos: 1 %
Hematocrit: 45.7 % (ref 34.0–46.6)
Hemoglobin: 15.3 g/dL (ref 11.1–15.9)
Immature Grans (Abs): 0 10*3/uL (ref 0.0–0.1)
Immature Granulocytes: 0 %
Lymphocytes Absolute: 1.9 10*3/uL (ref 0.7–3.1)
Lymphs: 23 %
MCH: 29.7 pg (ref 26.6–33.0)
MCHC: 33.5 g/dL (ref 31.5–35.7)
MCV: 89 fL (ref 79–97)
Monocytes Absolute: 0.5 10*3/uL (ref 0.1–0.9)
Monocytes: 6 %
Neutrophils Absolute: 5.6 10*3/uL (ref 1.4–7.0)
Neutrophils: 69 %
Platelets: 265 10*3/uL (ref 150–450)
RBC: 5.16 x10E6/uL (ref 3.77–5.28)
RDW: 13 % (ref 11.7–15.4)
WBC: 8.1 10*3/uL (ref 3.4–10.8)

## 2024-01-06 LAB — THYROID PANEL WITH TSH
Free Thyroxine Index: 2.1 (ref 1.2–4.9)
T3 Uptake Ratio: 27 % (ref 24–39)
T4, Total: 7.7 ug/dL (ref 4.5–12.0)
TSH: 2.86 u[IU]/mL (ref 0.450–4.500)

## 2024-01-06 LAB — LIPID PANEL
Chol/HDL Ratio: 3.8 ratio (ref 0.0–4.4)
Cholesterol, Total: 186 mg/dL (ref 100–199)
HDL: 49 mg/dL (ref >39)
LDL Chol Calc (NIH): 104 mg/dL — ABNORMAL HIGH (ref 0–99)
Triglycerides: 189 mg/dL — ABNORMAL HIGH (ref 0–149)
VLDL Cholesterol Cal: 33 mg/dL (ref 5–40)

## 2024-01-06 LAB — VITAMIN D 25 HYDROXY (VIT D DEFICIENCY, FRACTURES): Vit D, 25-Hydroxy: 22.5 ng/mL — ABNORMAL LOW (ref 30.0–100.0)

## 2024-01-08 MED ORDER — POTASSIUM CHLORIDE CRYS ER 20 MEQ PO TBCR
20.0000 meq | EXTENDED_RELEASE_TABLET | Freq: Every day | ORAL | 0 refills | Status: DC
Start: 2024-01-08 — End: 2024-02-06

## 2024-01-08 NOTE — Addendum Note (Signed)
 Addended by: Sonny Masters on: 01/08/2024 07:53 PM   Modules accepted: Orders

## 2024-01-15 NOTE — Addendum Note (Signed)
 Addended by: Rudolfo Cosier C on: 01/15/2024 10:32 AM   Modules accepted: Orders

## 2024-01-18 ENCOUNTER — Other Ambulatory Visit: Payer: Self-pay | Admitting: Family Medicine

## 2024-01-18 DIAGNOSIS — J301 Allergic rhinitis due to pollen: Secondary | ICD-10-CM

## 2024-01-18 DIAGNOSIS — I10 Essential (primary) hypertension: Secondary | ICD-10-CM

## 2024-01-25 ENCOUNTER — Telehealth: Payer: Self-pay

## 2024-01-25 NOTE — Telephone Encounter (Signed)
 Copied from CRM [id number]. Topic: Clinical - Prescription Issue >> Jan 25, 2024  8:20 AM Ivette P wrote: Reason for CRM: Pt called in to see about which medications she needs to take in morning and at night, pt would like for someone to call her and explain.    Pt would also like to know if she needed to continue taking medication potassium chloride  SA (KLOR-CON  M) 20 MEQ tablet. Pt was not given refill and would like to know if she was supposed to get refill.   If pt can be called between.  12pm  and 1pm - will be on lunch    PT callback [phone]

## 2024-01-25 NOTE — Telephone Encounter (Signed)
 Copied from CRM [id number]. Topic: Clinical - Medication Question >> Jan 25, 2024  3:54 PM Georgeann Kindred wrote: Reason for CRM: Patient would like for FNP Rakes to wait until Monday to send the prescription for the Potassium on Monday due to her getting paid on Friday.

## 2024-01-25 NOTE — Telephone Encounter (Signed)
 Patient was VERY RUDE in stating she can not come tomorrow to get labs when she has not even started the potassium.  She can not get prescription until next Friday when she gets paid.  Please advise

## 2024-01-25 NOTE — Telephone Encounter (Signed)
 There are multiple encounters regarding this, please see other telephone encounter.

## 2024-01-26 ENCOUNTER — Telehealth: Payer: Self-pay

## 2024-01-26 NOTE — Telephone Encounter (Signed)
 Copied from CRM [id number]. Topic: Clinical - Medication Question >> Jan 26, 2024 10:20 AM Carlatta H wrote: Reason for CRM: Patient received a call from [place], Janeal Mealing, CMA //Please return call if needed//patient stated she wont be able to pick up prescription for potassium until next Friday and then she will schedule labs//

## 2024-01-26 NOTE — Telephone Encounter (Signed)
Noted  -LS

## 2024-01-26 NOTE — Telephone Encounter (Signed)
 Lm on vm on 4/25 LS

## 2024-01-29 NOTE — Telephone Encounter (Signed)
 lmtcb

## 2024-02-02 ENCOUNTER — Other Ambulatory Visit: Payer: Self-pay | Admitting: Family Medicine

## 2024-02-02 DIAGNOSIS — E876 Hypokalemia: Secondary | ICD-10-CM

## 2024-02-02 NOTE — Telephone Encounter (Signed)
 Copied from CRM [id number]. Topic: Clinical - Medication Refill >> Feb 02, 2024 12:56 PM Santiya F wrote: Most Recent Primary Care Visit:   Medication: potassium chloride  SA (KLOR-CON  M) 20 MEQ tablet [[id number]]  Has the patient contacted their pharmacy? Yes  (Agent: If yes, when and what did the pharmacy advise?) Contact office  Is this the correct pharmacy for this prescription? Yes  This is the patient's preferred pharmacy:  [facility] [address], [place] - [address] Consuela Denier [address] [place] [address] Phone: [phone] Fax: [phone]   Has the prescription been filled recently? Yes  Is the patient out of the medication? Yes  Has the patient been seen for an appointment in the last year OR does the patient have an upcoming appointment? Yes  Can we respond through MyChart? Yes  Agent: Please be advised that Rx refills may take up to 3 business days. We ask that you follow-up with your pharmacy.

## 2024-02-03 ENCOUNTER — Other Ambulatory Visit: Payer: Self-pay | Admitting: Family Medicine

## 2024-02-03 DIAGNOSIS — E876 Hypokalemia: Secondary | ICD-10-CM

## 2024-02-05 ENCOUNTER — Telehealth: Payer: Self-pay

## 2024-02-05 NOTE — Telephone Encounter (Signed)
 Attempted to contact patient  NA  Several attempts have been made to patient with no call back   Letter mailed asking patient to get in touch with us  about needing lab work done.

## 2024-02-05 NOTE — Telephone Encounter (Unsigned)
 Copied from CRM [id number]. Topic: General - Other >> Feb 05, 2024 12:37 PM Wanda Murphy wrote: Reason for CRM: Patient states that she cannot do lab appt  until she has a few weeks of potassium medicine in her, and still has not yet received the medication

## 2024-02-05 NOTE — Telephone Encounter (Signed)
 Refill request received for potassium chloride  20 meq tablet. Per chart review 90 tablets were prescribed on 4/7. RN called pt to inform him and LVM with a callback number.

## 2024-02-06 ENCOUNTER — Other Ambulatory Visit: Payer: Self-pay

## 2024-02-06 DIAGNOSIS — E876 Hypokalemia: Secondary | ICD-10-CM

## 2024-02-06 MED ORDER — POTASSIUM CHLORIDE CRYS ER 20 MEQ PO TBCR: 20.0000 meq | EXTENDED_RELEASE_TABLET | Freq: Every day | ORAL | 0 refills | Status: DC

## 2024-02-06 NOTE — Telephone Encounter (Signed)
 Re-sent to pharmacy.

## 2024-02-12 ENCOUNTER — Other Ambulatory Visit: Payer: Self-pay | Admitting: Family Medicine

## 2024-02-12 DIAGNOSIS — E876 Hypokalemia: Secondary | ICD-10-CM

## 2024-02-12 MED ORDER — POTASSIUM CHLORIDE 10 % PO SOLN: 20.0000 meq | Freq: Every day | ORAL | 0 refills | Status: AC

## 2024-02-12 NOTE — Telephone Encounter (Signed)
 Copied from CRM [id number]. Topic: Clinical - Prescription Issue >> Feb 12, 2024  8:26 AM Shelby Dessert H wrote: Reason for CRM: Patient called and stated that the potassium chloride  SA she can't take because its pill form and could you send her a new prescription in for the liquid form to the pharmacy  [facility] [address], [place], [place] [address]  23 [address] [phone]

## 2024-03-18 ENCOUNTER — Telehealth: Payer: Self-pay | Admitting: Family Medicine

## 2024-06-17 ENCOUNTER — Encounter: Payer: Self-pay | Admitting: Family Medicine

## 2024-06-17 ENCOUNTER — Ambulatory Visit: Admitting: Family Medicine

## 2024-06-17 VITALS — Ht 65.0 in

## 2024-06-17 DIAGNOSIS — J0110 Acute frontal sinusitis, unspecified: Secondary | ICD-10-CM

## 2024-06-17 MED ORDER — AMOXICILLIN 400 MG/5ML PO SUSR: 1000.0000 mg | Freq: Two times a day (BID) | ORAL | 0 refills | Status: AC

## 2024-06-17 MED ORDER — FLUTICASONE PROPIONATE 50 MCG/ACT NA SUSP: 1.0000 | Freq: Two times a day (BID) | NASAL | 2 refills | Status: AC | PRN

## 2024-06-17 NOTE — Progress Notes (Signed)
 Ht 5' 5 (1.651 m)   BMI 42.97 kg/m    Subjective:   Patient ID: Wanda Murphy, female    DOB: 08/19/1971, 53 y.o.   MRN: [id number]  HPI: [name] is a 53 y.o. female presenting on 06/17/2024 for Facial Pain   Discussed the use of AI scribe software for clinical note transcription with the patient, who gave verbal consent to proceed.  History of Present Illness   Wanda Murphy is a 53 year old female who presents with symptoms suggestive of an ear infection and sinus pressure.  She experiences pressure on the left side of her face, affecting her whole cheek and the area through her eyes.  She had a fever of 101.46F on Friday night, approximately three days ago, but has not had a fever since.  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  She has had nasal drainage and coughing for two to three weeks, which she attributes to changes in weather and allergies. The cough is more pronounced in the morning and at night, and she feels like she is trying to cough something up.  She has been using generic ibuprofen , which helps with the pain but not with the coughing.  She has not received a flu shot or COVID vaccination this year, although she has had COVID in the past.  She reports poor sleep quality, waking up every couple of hours, and sometimes feels 'bubbles popping' in her throat when breathing.          Relevant past medical, surgical, family and social history reviewed and updated as indicated. Interim medical history since our last visit reviewed. Allergies and medications reviewed and updated.  Review of Systems  Constitutional:  Negative for chills and fever.  HENT:  Positive for congestion, postnasal drip, sinus pressure and sinus pain. Negative for ear discharge, ear pain and sore throat.   Eyes:  Negative for redness and visual disturbance.  Respiratory:  Positive for  cough. Negative for chest tightness and shortness of breath.   Cardiovascular:  Negative for chest pain and leg swelling.  Genitourinary:  Negative for difficulty urinating and dysuria.  Musculoskeletal:  Negative for back pain and gait problem.  Skin:  Negative for rash.  Neurological:  Negative for light-headedness and headaches.  Psychiatric/Behavioral:  Negative for agitation and behavioral problems.   All other systems reviewed and are negative.   Per HPI unless specifically indicated above   Allergies as of 06/17/2024   No Known Allergies      Medication List        Accurate as of June 17, 2024  4:13 PM. If you have any questions, ask your nurse or doctor.          amoxicillin  400 MG/5ML suspension Commonly known as: AMOXIL  Take 12.5 mLs (1,000 mg total) by mouth 2 (two) times daily for 10 days. Started by: Fonda LABOR Chanetta Moosman   chlorthalidone  25 MG tablet Commonly known as: HYGROTON  Take 1 tablet by mouth once daily   fluticasone  50 MCG/ACT nasal spray Commonly known as: FLONASE  Place 1 spray into both nostrils 2 (two) times daily as needed for allergies or rhinitis. Started by: Fonda LABOR Christon Gallaway   losartan  50 MG tablet Commonly known as: COZAAR  Take 1 tablet (50 mg total) by mouth daily.   Potassium Chloride  10 % Soln Take 20 mEq by mouth daily.  Objective:   Ht 5' 5 (1.651 m)   BMI 42.97 kg/m   Wt Readings from Last 3 Encounters:  01/05/24 258 lb 3.2 oz (117.1 kg)  12/18/23 261 lb 3.2 oz (118.5 kg)  06/02/23 264 lb (119.7 kg)    Physical Exam Physical Exam   HEENT: Ears normal, no infection bilaterally. NECK: Cervical lymphadenopathy bilaterally. CHEST: Lungs clear to auscultation bilaterally. CARDIOVASCULAR: Heart regular rate and rhythm, no murmurs.         Assessment & Plan:   Problem List Items Addressed This Visit   None Visit Diagnoses       Acute non-recurrent frontal sinusitis    -  Primary   Relevant  Medications   amoxicillin  (AMOXIL ) 400 MG/5ML suspension   fluticasone  (FLONASE ) 50 MCG/ACT nasal spray          Acute sinusitis Acute sinusitis with sinus pressure, nasal drainage, and cough for 2-3 weeks. Differential favors sinus infection over ear infection. Possible allergy component. - Prescribe moxetilin in liquid form due to dysphagia. - Prescribe Flonase  nasal spray for sinus clearance and pressure relief. - Advise using Flonase  at symptom onset in the future. - Instruct to return if symptoms do not improve with Flonase .  Potential sleep apnea Potential sleep apnea due to poor sleep and possible airway obstruction. - Recommend discussing sleep apnea testing with primary care provider, Dr. Severa.          Follow up plan: Return if symptoms worsen or fail to improve.  Counseling provided for all of the vaccine components No orders of the defined types were placed in this encounter.   Fonda Levins, MD [facility] 06/17/2024, 4:13 PM

## 2024-08-14 DIAGNOSIS — Z419 Encounter for procedure for purposes other than remedying health state, unspecified: Secondary | ICD-10-CM | POA: Diagnosis not present

## 2024-09-05 ENCOUNTER — Encounter: Payer: Self-pay | Admitting: Family Medicine

## 2024-09-09 ENCOUNTER — Encounter: Payer: Self-pay | Admitting: Family Medicine

## 2024-09-20 ENCOUNTER — Ambulatory Visit: Admitting: Family Medicine

## 2024-09-23 ENCOUNTER — Encounter: Payer: Self-pay | Admitting: Family Medicine
